# Patient Record
Sex: Female | Born: 1995 | Race: Black or African American | Hispanic: No | Marital: Single | Smoking: Never smoker
Health system: Southern US, Community
[De-identification: ages and names within clinical notes are randomized; demographics above are authoritative.]

## PROBLEM LIST (undated history)

## (undated) ENCOUNTER — Inpatient Hospital Stay (HOSPITAL_COMMUNITY): Payer: Self-pay

## (undated) DIAGNOSIS — Z789 Other specified health status: Secondary | ICD-10-CM

## (undated) DIAGNOSIS — F419 Anxiety disorder, unspecified: Secondary | ICD-10-CM

## (undated) DIAGNOSIS — F32A Depression, unspecified: Secondary | ICD-10-CM

## (undated) DIAGNOSIS — R519 Headache, unspecified: Secondary | ICD-10-CM

## (undated) DIAGNOSIS — N83209 Unspecified ovarian cyst, unspecified side: Secondary | ICD-10-CM

## (undated) DIAGNOSIS — R51 Headache: Secondary | ICD-10-CM

## (undated) DIAGNOSIS — N390 Urinary tract infection, site not specified: Secondary | ICD-10-CM

## (undated) HISTORY — PX: NO PAST SURGERIES: SHX2092

## (undated) HISTORY — PX: DILATION AND CURETTAGE OF UTERUS: SHX78

---
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
## 2017-08-03 ENCOUNTER — Emergency Department (HOSPITAL_BASED_OUTPATIENT_CLINIC_OR_DEPARTMENT_OTHER)
Admission: EM | Admit: 2017-08-03 | Discharge: 2017-08-03 | Disposition: A | Discharge status: Home / Self Care | Payer: Self-pay | Attending: Emergency Medicine | Admitting: Emergency Medicine

## 2017-08-03 ENCOUNTER — Other Ambulatory Visit: Payer: Self-pay

## 2017-08-03 ENCOUNTER — Encounter (HOSPITAL_BASED_OUTPATIENT_CLINIC_OR_DEPARTMENT_OTHER): Payer: Self-pay | Admitting: *Deleted

## 2017-08-03 DIAGNOSIS — Z202 Contact with and (suspected) exposure to infections with a predominantly sexual mode of transmission: Secondary | ICD-10-CM | POA: Insufficient documentation

## 2017-08-03 DIAGNOSIS — N898 Other specified noninflammatory disorders of vagina: Secondary | ICD-10-CM

## 2017-08-03 DIAGNOSIS — N760 Acute vaginitis: Secondary | ICD-10-CM | POA: Insufficient documentation

## 2017-08-03 DIAGNOSIS — B9689 Other specified bacterial agents as the cause of diseases classified elsewhere: Secondary | ICD-10-CM

## 2017-08-03 DIAGNOSIS — N3000 Acute cystitis without hematuria: Secondary | ICD-10-CM | POA: Insufficient documentation

## 2017-08-03 LAB — URINALYSIS, MICROSCOPIC (REFLEX)

## 2017-08-03 LAB — URINALYSIS, ROUTINE W REFLEX MICROSCOPIC
Bilirubin Urine: NEGATIVE
GLUCOSE, UA: NEGATIVE mg/dL
Hgb urine dipstick: NEGATIVE
KETONES UR: NEGATIVE mg/dL
Nitrite: POSITIVE — AB
PH: 6 (ref 5.0–8.0)
Protein, ur: NEGATIVE mg/dL
Specific Gravity, Urine: 1.015 (ref 1.005–1.030)

## 2017-08-03 LAB — WET PREP, GENITAL
Sperm: NONE SEEN
TRICH WET PREP: NONE SEEN
YEAST WET PREP: NONE SEEN

## 2017-08-03 LAB — PREGNANCY, URINE: Preg Test, Ur: NEGATIVE

## 2017-08-03 MED ORDER — METRONIDAZOLE 500 MG PO TABS
500.0000 mg | ORAL_TABLET | Freq: Once | ORAL | Status: AC
  Administered 2017-08-03: 500 mg via ORAL
  Filled 2017-08-03: qty 1

## 2017-08-03 MED ORDER — ACYCLOVIR 400 MG PO TABS: 400.0000 mg | ORAL_TABLET | Freq: Three times a day (TID) | ORAL | 0 refills | Status: AC

## 2017-08-03 MED ORDER — CEPHALEXIN 250 MG PO CAPS
500.0000 mg | ORAL_CAPSULE | Freq: Once | ORAL | Status: AC
  Administered 2017-08-03: 500 mg via ORAL
  Filled 2017-08-03: qty 2

## 2017-08-03 MED ORDER — CEPHALEXIN 500 MG PO CAPS: 500.0000 mg | ORAL_CAPSULE | Freq: Two times a day (BID) | ORAL | 0 refills | Status: AC

## 2017-08-03 MED ORDER — METRONIDAZOLE 500 MG PO TABS: 500.0000 mg | ORAL_TABLET | Freq: Two times a day (BID) | ORAL | 0 refills | Status: DC

## 2017-08-03 MED ORDER — ACYCLOVIR 200 MG PO CAPS
400.0000 mg | ORAL_CAPSULE | Freq: Once | ORAL | Status: AC
  Administered 2017-08-03: 400 mg via ORAL
  Filled 2017-08-03: qty 2

## 2017-08-03 NOTE — Discharge Instructions (Signed)
Your workup and exam today revealed concern for bacterial vaginosis and urinary tract infection.  Your pregnancy test was negative.  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  Please follow-up with an OB/GYN for further management as well as a primary care physician.  If any symptoms change or worsen, please return to the nearest emergency department.

## 2017-08-03 NOTE — ED Triage Notes (Signed)
Pt states that she thinks that she is pregnant.  States that she missed a period (LMP: Sept 27). States nausea x 2 weeks. Negative pregnancy tests at home. Also reports that she was exposed to STDs. denies vaginal discharge-reports vaginal spotting Oct 20-denies vaginal irritation.

## 2017-08-03 NOTE — ED Provider Notes (Signed)
[facility] EMERGENCY DEPARTMENT Provider Note   CSN: [id number] Arrival date & time: 08/03/17  [id number]     History   Chief Complaint Chief Complaint  Patient presents with  . Exposure to STD    HPI Jamie Moore is a 21 y.o. female.  The history is provided by the patient and medical records. No language interpreter was used.  Vaginal Discharge   This is a recurrent problem. The current episode started more than 2 days ago. The problem occurs constantly. The problem has not changed since onset.The discharge was white. She is not pregnant. She has missed her period. Associated symptoms include abdominal pain (mild cramping) and genital lesions. Pertinent negatives include no diaphoresis, no fever, no abdominal swelling, no constipation, no diarrhea, no nausea, no vomiting, no dysuria, no frequency and no genital burning. She has tried nothing for the symptoms. The treatment provided no relief. Her past medical history is significant for irregular periods. Her past medical history does not include PID or ectopic pregnancy.    History reviewed. No pertinent past medical history.  There are no active problems to display for this patient.   History reviewed. No pertinent surgical history.  OB History    No data available       Home Medications    Prior to Admission medications   Not on File    Family History History reviewed. No pertinent family history.  Social History Social History   Tobacco Use  . Smoking status: Never Smoker  . Smokeless tobacco: Never Used  Substance Use Topics  . Alcohol use: No    Frequency: Never  . Drug use: No     Allergies   Patient has no known allergies.   Review of Systems Review of Systems  Constitutional: Negative for chills, diaphoresis, fatigue and fever.  HENT: Negative for congestion.   Eyes: Negative for photophobia.  Respiratory: Negative for cough, chest tightness, shortness of breath, wheezing and  stridor.   Gastrointestinal: Positive for abdominal pain (mild cramping). Negative for constipation, diarrhea, nausea and vomiting.  Genitourinary: Positive for vaginal bleeding and vaginal discharge. Negative for decreased urine volume, dysuria, frequency, genital sores, pelvic pain, urgency and vaginal pain.  Musculoskeletal: Negative for back pain, neck pain and neck stiffness.  Skin: Positive for rash (groin). Negative for wound.  Neurological: Negative for light-headedness and numbness.  Psychiatric/Behavioral: Negative for agitation.  All other systems reviewed and are negative.    Physical Exam Updated Vital Signs BP 123/86 (BP Location: Right Arm)   Pulse 100   Temp 98.3 F (36.8 C) (Oral)   Resp 18   LMP 06/18/2017   SpO2 100%   Physical Exam  Constitutional: She appears well-developed and well-nourished. No distress.  HENT:  Head: Normocephalic and atraumatic.  Mouth/Throat: Oropharynx is clear and moist. No oropharyngeal exudate.  Eyes: Conjunctivae and EOM are normal. Pupils are equal, round, and reactive to light.  Neck: Normal range of motion. Neck supple. No JVD present.  Cardiovascular: Normal rate and regular rhythm.  No murmur heard. Pulmonary/Chest: Effort normal and breath sounds normal. No stridor. No respiratory distress. She has no wheezes. She exhibits no tenderness.  Abdominal: Soft. There is no tenderness.  Genitourinary:    Pelvic exam was performed with patient supine. There is lesion on the right labia. There is no tenderness on the right labia. There is lesion on the left labia. There is no tenderness on the left labia. Cervix exhibits discharge. Cervix exhibits no motion  tenderness and no friability. Right adnexum displays no tenderness. Left adnexum displays no tenderness and no fullness. No erythema or bleeding in the vagina. No foreign body in the vagina. Vaginal discharge found.  Musculoskeletal: She exhibits no edema.  Neurological: She is  alert. No sensory deficit. She exhibits normal muscle tone.  Skin: Skin is warm and dry. Capillary refill takes less than 2 seconds. No erythema.  Psychiatric: She has a normal mood and affect.  Nursing note and vitals reviewed.    ED Treatments / Results  Labs (all labs ordered are listed, but only abnormal results are displayed) Labs Reviewed  WET PREP, GENITAL - Abnormal; Notable for the following components:      Result Value   Clue Cells Wet Prep HPF POC PRESENT (*)    WBC, Wet Prep HPF POC MANY (*)    All other components within normal limits  URINALYSIS, ROUTINE W REFLEX MICROSCOPIC - Abnormal; Notable for the following components:   APPearance CLOUDY (*)    Nitrite POSITIVE (*)    Leukocytes, UA MODERATE (*)    All other components within normal limits  URINALYSIS, MICROSCOPIC (REFLEX) - Abnormal; Notable for the following components:   Bacteria, UA MANY (*)    Squamous Epithelial / LPF 6-30 (*)    All other components within normal limits  HSV CULTURE AND TYPING  PREGNANCY, URINE  GC/CHLAMYDIA PROBE AMP ([place]) NOT AT [facility]    EKG  EKG Interpretation None       Radiology No results found.  Procedures Procedures (including critical care time)  Medications Ordered in ED Medications  acyclovir (ZOVIRAX) 200 MG capsule 400 mg (400 mg Oral Given 08/03/17 1143)  metroNIDAZOLE (FLAGYL) tablet 500 mg (500 mg Oral Given 08/03/17 1143)  cephALEXin (KEFLEX) capsule 500 mg (500 mg Oral Given 08/03/17 1143)     Initial Impression / Assessment and Plan / ED Course  I have reviewed the triage vital signs and the nursing notes.  Pertinent labs & imaging results that were available during my care of the patient were reviewed by me and considered in my medical decision making (see chart for details).      Jamie Moore is a 21 y.o. female with a past medical history significant for prior bacterial vaginosis who presents with mild abdominal cramping, nausea  with vomiting, vaginal discharge, and some vaginal spotting.  Patient says that her last menstrual cycle was in September.  She is concerned she missed her last period.  She is concerned she may be pregnant but has had negative pregnancy tests at home.  She is also concerned about vaginal discharge.  She reports that her significant other was told he had herpes and she would like to be checked for sexually transmitted infection.  She reports a white discharge that has been ongoing.  She says that she has missed menstrual cycles in the past with irregular periods but wants to be checked for pregnancy.  She denies any recent injuries or new medications.  She denies constipation or diarrhea.  On exam, abdomen is nontender.  Lungs are clear.  Chest is nontender.  Due to the report of discharge and vaginal spotting, pelvic exam will be performed.  Wet prep will be obtained as well as STI testing.  Urinalysis will also be performed due to the abdominal discomfort.  Urinary tract infection was discovered on urinalysis.  Pregnancy test negative.  Anticipate reassessment after pelvic exam.  Pelvic exam revealed discharge but no evidence of  cervical motion tenderness or adnexal tenderness.  Swabs were collected.  External exam revealed blisters surrounding the groin.  Wet prep revealed bacterial vaginosis.  External exam revealed the blisters given the known herpes exposure, patient will be treated with acyclovir.  Herpes swab was sent.  Patient will be treated with acyclovir for likely herpes, Keflex for UTI, and Flagyl for BV.  Pregnancy test was negative.  Patient instructed to follow-up with her PCP and OB/GYN for further management.  Patient given prescriptions for the antibiotics and antiviral.  Patient understood return precautions for any new or worsening symptoms.  Patient had no other questions or concerns and was discharged in good condition.  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 STD exposure   2. Bacterial vaginosis   3. Vaginal discharge   4. 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 Acute cystitis without hematuria     Disposition: Discharge  Condition: Good  I have discussed the results, Dx and Tx plan with the pt(& family if present). He/she/they expressed understanding and agree(s) with the plan. Discharge instructions discussed at great length. Strict return precautions discussed and pt &/or family have verbalized understanding of the instructions. No further questions at time of discharge.    This SmartLink is deprecated. Use AVSMEDLIST instead to display the medication list for a patient.  Follow Up: [facility] [address] [place] [address] [phone] Schedule an appointment as soon as possible for a visit    [facility] [address] [place] [address] [phone] Schedule an appointment as soon as possible for a visit    [facility] [place] [facility] [address] [phone] mc [address] [place] [address] [phone]  If symptoms worsen     Ryver Zadrozny, Canary Brim, MD 08/03/17 2019

## 2017-08-04 LAB — GC/CHLAMYDIA PROBE AMP (~~LOC~~) NOT AT ARMC
CHLAMYDIA, DNA PROBE: POSITIVE — AB
Neisseria Gonorrhea: NEGATIVE

## 2017-08-05 LAB — HSV CULTURE AND TYPING

## 2017-08-06 MED ORDER — CEFIXIME 100 MG/5ML PO SUSR: 400.0000 mg | Freq: Every day | ORAL | 0 refills | Status: DC

## 2017-08-06 MED ORDER — AZITHROMYCIN 500 MG PO TABS: 1000.0000 mg | ORAL_TABLET | Freq: Once | ORAL | 0 refills | Status: DC

## 2017-08-07 MED ORDER — AZITHROMYCIN 500 MG PO TABS: 1000.0000 mg | ORAL_TABLET | Freq: Once | ORAL | 0 refills | Status: AC

## 2017-08-07 NOTE — ED Notes (Signed)
Pt saw positive results in my chart  Call to ed to find out what to do,  md wrote prescriptions for suprax  And azithromycin   [facility]   0010  08/07/17

## 2017-09-22 NOTE — L&D Delivery Note (Signed)
OB/GYN Faculty Practice Delivery Note  Jamie Moore is a 22 y.o. G2P0010 s/p SVD at 6862w5d. She was admitted for IOL due to IUGR <10 percentile.   ROM: 0h 3740m with moderate meconium fluid GBS Status: Positive  Maximum Maternal Temperature: 37.0 C   Labor Progress:  Patient entered active labor 05/21/18. Dilation completes at 0959.   Delivery Date/Time: 05/21/2018 1036  Delivery: Called to room and patient was complete and pushing. Head delivered ROA. No nuchal cord present. 1 natural knot in cord. Shoulder and body delivered in usual fashion. Infant with spontaneous cry, placed on mother's abdomen, dried and stimulated. Cord clamped x 2 after 1-minute delay, and cut by FOB. Cord blood drawn. Placenta delivered spontaneously with no traction. Fundus firm with massage and Pitocin. Labia, perineum, vagina, and cervix inspected inspected.   Placenta: intact  Complications: post partum hemorrhage (600 mL EBL_  Lacerations:  Two lacerations, first degree sulcal and right periurethral. Periurethral repaired with 4.0 rapide and sulcal repaired with 4.0 vicryl. EBL: 600 ml   Postpartum Planning [ ]  message to sent to schedule follow-up  [ ]  vaccines UTD  Infant: Female   APGARs 9 at 1 and 5 minutes  John Derrel NipVictor Cresenzo, MS4  [employer]  Attestation: I have seen this patient and agree with the medical student's documentation. I was present for the entire delivery and performed the repair.   Cristal DeerLaurel S. Earlene PlaterWallace, DO OB/GYN Fellow

## 2017-10-19 ENCOUNTER — Other Ambulatory Visit (HOSPITAL_BASED_OUTPATIENT_CLINIC_OR_DEPARTMENT_OTHER): Payer: Self-pay

## 2017-10-19 ENCOUNTER — Emergency Department (HOSPITAL_BASED_OUTPATIENT_CLINIC_OR_DEPARTMENT_OTHER)
Admission: EM | Admit: 2017-10-19 | Discharge: 2017-10-19 | Disposition: A | Discharge status: Home / Self Care | Payer: Managed Care, Other (non HMO) | Attending: Emergency Medicine | Admitting: Emergency Medicine

## 2017-10-19 ENCOUNTER — Other Ambulatory Visit: Payer: Self-pay

## 2017-10-19 ENCOUNTER — Encounter (HOSPITAL_BASED_OUTPATIENT_CLINIC_OR_DEPARTMENT_OTHER): Payer: Self-pay | Admitting: *Deleted

## 2017-10-19 DIAGNOSIS — Z711 Person with feared health complaint in whom no diagnosis is made: Secondary | ICD-10-CM | POA: Insufficient documentation

## 2017-10-19 DIAGNOSIS — Z3A09 9 weeks gestation of pregnancy: Secondary | ICD-10-CM | POA: Diagnosis not present

## 2017-10-19 DIAGNOSIS — O26891 Other specified pregnancy related conditions, first trimester: Secondary | ICD-10-CM | POA: Insufficient documentation

## 2017-10-19 DIAGNOSIS — R109 Unspecified abdominal pain: Secondary | ICD-10-CM | POA: Insufficient documentation

## 2017-10-19 DIAGNOSIS — N898 Other specified noninflammatory disorders of vagina: Secondary | ICD-10-CM | POA: Insufficient documentation

## 2017-10-19 DIAGNOSIS — O26899 Other specified pregnancy related conditions, unspecified trimester: Secondary | ICD-10-CM

## 2017-10-19 LAB — WET PREP, GENITAL
SPERM: NONE SEEN
Trich, Wet Prep: NONE SEEN
Yeast Wet Prep HPF POC: NONE SEEN

## 2017-10-19 LAB — URINALYSIS, MICROSCOPIC (REFLEX)

## 2017-10-19 LAB — URINALYSIS, ROUTINE W REFLEX MICROSCOPIC
Bilirubin Urine: NEGATIVE
GLUCOSE, UA: NEGATIVE mg/dL
Hgb urine dipstick: NEGATIVE
KETONES UR: NEGATIVE mg/dL
NITRITE: NEGATIVE
PH: 6.5 (ref 5.0–8.0)
PROTEIN: NEGATIVE mg/dL
Specific Gravity, Urine: 1.02 (ref 1.005–1.030)

## 2017-10-19 LAB — GC/CHLAMYDIA PROBE AMP (~~LOC~~) NOT AT ARMC
Chlamydia: POSITIVE — AB
NEISSERIA GONORRHEA: NEGATIVE

## 2017-10-19 LAB — HCG, QUANTITATIVE, PREGNANCY: hCG, Beta Chain, Quant, S: 127902 m[IU]/mL — ABNORMAL HIGH (ref <5)

## 2017-10-19 MED ORDER — AZITHROMYCIN 250 MG PO TABS
1000.0000 mg | ORAL_TABLET | Freq: Once | ORAL | Status: AC
  Administered 2017-10-19: 1000 mg via ORAL
  Filled 2017-10-19: qty 4

## 2017-10-19 MED ORDER — LIDOCAINE HCL (PF) 1 % IJ SOLN
INTRAMUSCULAR | Status: AC
  Administered 2017-10-19: 0.9 mL
  Filled 2017-10-19: qty 5

## 2017-10-19 MED ORDER — CEFTRIAXONE SODIUM 250 MG IJ SOLR
250.0000 mg | Freq: Once | INTRAMUSCULAR | Status: AC
  Administered 2017-10-19: 250 mg via INTRAMUSCULAR
  Filled 2017-10-19: qty 250

## 2017-10-19 NOTE — ED Provider Notes (Signed)
[facility] EMERGENCY DEPARTMENT Provider Note   CSN: [id number] Arrival date & time: 10/19/17  0406     History   Chief Complaint Chief Complaint  Patient presents with  . [redacted] weeks pregnant and cramping.    HPI Jamie Moore is a 22 y.o. female.  HPI  This is a 22 year old G2P0 currently [redacted] weeks pregnant who presents with abdominal and concerns for STD.  Patient's last menstrual period was August 14, 2017.  She has had suprapubic cramping since finding out she was pregnant.  She reports 1 day over 2 weeks ago where she had some mild vaginal spotting.  She did not see a physician.  She did have an ultrasound when she found out she was pregnant.  At that time there was a single intrauterine gestational sac at approximately 5 weeks.  She has not had any further ultrasound testing.  She is due to see her OB on February 8.  Patient reports that she has taken Tylenol occasionally with some relief.  Currently she rates her pain at 3 out of 10.  She reports a vaginal discharge and is concerned for STDs as a partner has tested positive for chlamydia.  She denies any dysuria or back pain.  History reviewed. No pertinent past medical history.  There are no active problems to display for this patient.   History reviewed. No pertinent surgical history.  OB History    Gravida Para Term Preterm AB Living   1             SAB TAB Ectopic Multiple Live Births                   Home Medications    Prior to Admission medications   Medication Sig Start Date End Date Taking? Authorizing Provider  cefixime (SUPRAX) 100 MG/5ML suspension Take 20 mLs (400 mg total) daily by mouth. 08/06/17   Tegeler, Canary Brim, MD  metroNIDAZOLE (FLAGYL) 500 MG tablet Take 1 tablet (500 mg total) 2 (two) times daily by mouth. 08/03/17   Tegeler, Canary Brim, MD    Family History No family history on file.  Social History Social History   Tobacco Use  . Smoking status: Never Smoker    . Smokeless tobacco: Never Used  Substance Use Topics  . Alcohol use: No    Frequency: Never  . Drug use: No     Allergies   Patient has no known allergies.   Review of Systems Review of Systems  Constitutional: Negative for fever.  Gastrointestinal: Positive for abdominal pain. Negative for nausea and vomiting.  Genitourinary: Positive for vaginal discharge. Negative for dysuria and hematuria.  All other systems reviewed and are negative.    Physical Exam Updated Vital Signs BP 117/77 (BP Location: Left Arm)   Pulse 84   Temp 98.5 F (36.9 C) (Oral)   Resp 18   Ht 5\' 3"  (1.6 m)   Wt 77.1 kg (170 lb)   LMP 08/16/2017 (Exact Date)   SpO2 100%   BMI 30.11 kg/m   Physical Exam  Constitutional: She is oriented to person, place, and time. She appears well-developed and well-nourished. No distress.  HENT:  Head: Normocephalic and atraumatic.  Cardiovascular: Normal rate, regular rhythm and normal heart sounds.  No murmur heard. Pulmonary/Chest: Effort normal and breath sounds normal. No respiratory distress. She has no wheezes.  Abdominal: Soft. There is no tenderness.  Genitourinary:  Genitourinary Comments: Moderate white vaginal discharge, no cervical motion  tenderness, no adnexal tenderness or mass noted  Neurological: She is alert and oriented to person, place, and time.  Skin: Skin is warm and dry.  Psychiatric: She has a normal mood and affect.  Nursing note and vitals reviewed.    ED Treatments / Results  Labs (all labs ordered are listed, but only abnormal results are displayed) Labs Reviewed  WET PREP, GENITAL - Abnormal; Notable for the following components:      Result Value   Clue Cells Wet Prep HPF POC PRESENT (*)    WBC, Wet Prep HPF POC MANY (*)    All other components within normal limits  URINALYSIS, ROUTINE W REFLEX MICROSCOPIC - Abnormal; Notable for the following components:   APPearance CLOUDY (*)    Leukocytes, UA TRACE (*)    All  other components within normal limits  URINALYSIS, MICROSCOPIC (REFLEX) - Abnormal; Notable for the following components:   Bacteria, UA MANY (*)    Squamous Epithelial / LPF 6-30 (*)    All other components within normal limits  URINE CULTURE  HCG, QUANTITATIVE, PREGNANCY  GC/CHLAMYDIA PROBE AMP ([place]) NOT AT [facility]    EKG  EKG Interpretation None       Radiology No results found.  Procedures Procedures (including critical care time)  EMERGENCY DEPARTMENT US PREGNANCY "Study: Limited Ultrasound of the Pelvis for Pregnancy"  INDICATIONS:Pregnancy(required) Multiple views of the uterus and pelvic cavity were obtained in real-time with a multi-frequency probe.  APPROACH:Transabdominal  PERFORMED BY: Myself IMAGES ARCHIVED?: Yes LIMITATIONS: Emergent procedure PREGNANCY FREE FLUID: None ADNEXAL FINDINGS: not visualize GESTATIONAL AGE, ESTIMATE: 9 wks FETAL HEART RATE: 167 INTERPRETATION: Yolk sac noted and Fetal heart activity seen      Medications Ordered in ED Medications  cefTRIAXone (ROCEPHIN) injection 250 mg (not administered)  azithromycin (ZITHROMAX) tablet 1,000 mg (not administered)  lidocaine (PF) (XYLOCAINE) 1 % injection (not administered)     Initial Impression / Assessment and Plan / ED Course  I have reviewed the triage vital signs and the nursing notes.  Pertinent labs & imaging results that were available during my care of the patient were reviewed by me and considered in my medical decision making (see chart for details).     She presents with persistent abdominal cramping during pregnancy and concerns for STD.  She is nontoxic on exam.  No lateralizing tenderness or adnexal masses noted on exam.  No bleeding noted.  Bedside ultrasound with single live intrauterine pregnancy with fetal heart rate of 167.  This is reassuring.  Patient was tested and treated for STDs.  Instructed to abstain from sexual activity for the next 10 days and  always use condoms.  Recommend close OB follow-up and formal ultrasound later today.  At this time no indication for heterotopic pregnancy.  After history, exam, and medical workup I feel the patient has been appropriately medically screened and is safe for discharge home. Pertinent diagnoses were discussed with the patient. Patient was given return precautions.   Final Clinical Impressions(s) / ED Diagnoses   Final diagnoses:  Concern about STD in female without diagnosis  Abdominal pain affecting pregnancy    ED Discharge Orders        Ordered    US OP OB Comp Less 14 Wks     10/19/17 0522       Shon BatonHorton, Sharrell Krawiec F, MD 10/19/17 [phone]

## 2017-10-19 NOTE — Discharge Instructions (Signed)
You were seen today with concerns for an STD.  You also experienced some abdominal cramping during pregnancy.  Your bedside ultrasound is reassuring with a fetal heart rate of 167.  It is very important that you follow-up for formal ultrasound and see her OB/GYN.  If you develop bleeding or any new or worsening symptoms you need to be reevaluated.  Regarding STDs, you were treated.  Abstain from sexual activity for the next 10 days.  Always use condoms.

## 2017-10-19 NOTE — ED Triage Notes (Signed)
Pt c/o lower abdominal cramping for the past 2 weeks. On and off per pt. C/o general h/a that she attributes to her cramping. Pt states that she could have been exposed to an STD specifically  Chlamydia. C/o vaginal d/c that is white in color.

## 2017-10-19 NOTE — ED Notes (Signed)
Snack given.

## 2017-10-21 LAB — URINE CULTURE: Culture: >=100000 — AB

## 2017-10-22 ENCOUNTER — Telehealth: Payer: Self-pay | Admitting: *Deleted

## 2017-10-22 NOTE — Telephone Encounter (Signed)
Post ED Visit - Positive Culture Follow-up: Unsuccessful Patient Follow-up  Culture assessed and recommendations reviewed by:  []  Enzo BiNathan Batchelder, Pharm.D. []  Celedonio MiyamotoJeremy Frens, Pharm.D., BCPS AQ-ID [x]  Garvin FilaMike Maccia, Pharm.D., BCPS []  Georgina PillionElizabeth Martin, Pharm.D., BCPS []  BrockwayMinh Pham, [address]Pharm.D., BCPS, AAHIVP []  Estella HuskMichelle Turner, Pharm.D., BCPS, AAHIVP []  Lysle Pearlachel Rumbarger, PharmD, BCPS []  Casilda Carlsaylor Stone, PharmD, BCPS []  Pollyann SamplesAndy Johnston, PharmD, BCPS  Positive urine culture  [x]  Patient discharged without antimicrobial prescription and treatment is now indicated []  Organism is resistant to prescribed ED discharge antimicrobial []  Patient with positive blood cultures   Unable to contact patient after 3 attempts, letter will be sent to address on file  Lysle PearlRobertson, Rasheeda [place]ve[place] Talley 10/22/2017, 10:01 AM

## 2017-10-22 NOTE — Progress Notes (Signed)
ED Antimicrobial Stewardship Positive Culture Follow Up   Jamie Moore is an 22 y.o. female who [facility] with a chief complaint of  Chief Complaint  Patient presents with  . [redacted] weeks pregnant and cramping.    Recent Results (from the past 720 hour(s))  Urine culture     Status: Abnormal   Collection Time: 10/19/17  4:25 AM  Result Value Ref Range Status   Specimen Description URINE, RANDOM  Final   Special Requests NONE  Final   Culture >=100,000 COLONIES/mL ESCHERICHIA COLI (A)  Final   Report Status 10/21/2017 FINAL  Final   Organism ID, Bacteria ESCHERICHIA COLI (A)  Final      Susceptibility   Escherichia coli - MIC*    AMPICILLIN <=2 SENSITIVE Sensitive     CEFAZOLIN <=4 SENSITIVE Sensitive     CEFTRIAXONE <=1 SENSITIVE Sensitive     CIPROFLOXACIN >=4 RESISTANT Resistant     GENTAMICIN <=1 SENSITIVE Sensitive     IMIPENEM <=0.25 SENSITIVE Sensitive     NITROFURANTOIN <=16 SENSITIVE Sensitive     TRIMETH/SULFA <=20 SENSITIVE Sensitive     AMPICILLIN/SULBACTAM <=2 SENSITIVE Sensitive     PIP/TAZO <=4 SENSITIVE Sensitive     Extended ESBL NEGATIVE Sensitive     * >=100,000 COLONIES/mL ESCHERICHIA COLI  Wet prep, genital     Status: Abnormal   Collection Time: 10/19/17  5:01 AM  Result Value Ref Range Status   Yeast Wet Prep HPF POC NONE SEEN NONE SEEN Final   Trich, Wet Prep NONE SEEN NONE SEEN Final   Clue Cells Wet Prep HPF POC PRESENT (A) NONE SEEN Final   WBC, Wet Prep HPF POC MANY (A) NONE SEEN Final   Sperm NONE SEEN  Final   [x]  Patient discharged originally without antimicrobial agent and treatment is now indicated Asymptomatic bacteriuria in pregnant patient  New antibiotic prescription: amox 500 bid x 1 week  ED Provider: Terance Hart*Jamie Gekas, PA-C  Jamie Moore 10/22/2017, 8:49 AM Infectious Diseases Pharmacist Phone# [phone]

## 2018-03-04 ENCOUNTER — Other Ambulatory Visit (HOSPITAL_COMMUNITY): Payer: Self-pay | Admitting: Specialist

## 2018-03-04 DIAGNOSIS — Z3A31 31 weeks gestation of pregnancy: Secondary | ICD-10-CM

## 2018-03-10 ENCOUNTER — Encounter (HOSPITAL_COMMUNITY): Payer: Self-pay

## 2018-03-17 ENCOUNTER — Encounter (HOSPITAL_COMMUNITY): Payer: Self-pay | Admitting: *Deleted

## 2018-03-19 ENCOUNTER — Ambulatory Visit (HOSPITAL_COMMUNITY)
Admission: RE | Admit: 2018-03-19 | Discharge: 2018-03-19 | Disposition: A | Discharge status: Home / Self Care | Payer: Managed Care, Other (non HMO) | Source: Ambulatory Visit | Attending: Specialist | Admitting: Specialist

## 2018-03-19 ENCOUNTER — Encounter (HOSPITAL_COMMUNITY): Payer: Self-pay

## 2018-03-19 ENCOUNTER — Other Ambulatory Visit (HOSPITAL_COMMUNITY): Payer: Self-pay | Admitting: Specialist

## 2018-03-19 DIAGNOSIS — Z363 Encounter for antenatal screening for malformations: Secondary | ICD-10-CM

## 2018-03-19 DIAGNOSIS — Z3A30 30 weeks gestation of pregnancy: Secondary | ICD-10-CM | POA: Insufficient documentation

## 2018-03-19 DIAGNOSIS — O365930 Maternal care for other known or suspected poor fetal growth, third trimester, not applicable or unspecified: Secondary | ICD-10-CM | POA: Insufficient documentation

## 2018-03-19 DIAGNOSIS — Z3A31 31 weeks gestation of pregnancy: Secondary | ICD-10-CM

## 2018-03-19 DIAGNOSIS — Z3689 Encounter for other specified antenatal screening: Secondary | ICD-10-CM | POA: Insufficient documentation

## 2018-03-19 DIAGNOSIS — O365990 Maternal care for other known or suspected poor fetal growth, unspecified trimester, not applicable or unspecified: Secondary | ICD-10-CM | POA: Insufficient documentation

## 2018-03-19 HISTORY — DX: Other specified health status: Z78.9

## 2018-03-19 NOTE — Progress Notes (Signed)
MFM Consult, Staff Note:   Impressions: Single living intrauterine pregnancy at 30w 5d.   Placenta Anterior, above cervical os.   EFW 15th%'le with AC <3rd%'le is consistent with IUGR Normal amniotic fluid volume. The fetal anatomic survey is not complete. No gross fetal anomalies identified.  UA Doppler studies are normal BPP 8/8 The cervix measures 3.44cm transabdominally without funneling. The adnexa appear normal bilaterally without masses.    Discussion:  At bedside, I reviewed today's ultrasound findings and placed them in context of my impression and needed pregnancy surveillance.  I explained to her that according to current obstetrical conventions, her fetus meets criteria for intrauterine growth restriction.  That being said, her fetus demonstrates normal UA Doppler waveforms, normal AFI, and a reassuring biophysical profile.  I advised her that in setting of uncomplicated IUGR, she would need to monitor fetal kick counts at home and have weekly fluid volume assessments, biophysical profiles, and umbilical artery Doppler studies with continued surveillance of interval fetal growth as plotted by composite biometric assessments every 3-4 weeks of this pregnancy until delivery.  Provided the IUGR diagnosis remains uncomplicated and antenatal testing remains reassuring, delivery at 38-39 weeks is recommended in accordance with ACOG and SMFM guidelines.  Summary of Recommendations: 1. continued interval growth monthly 2. weekly UA Doppler/BPP/AFI 3. fetal kick counts 4. provided IUGR remains uncomplicated, recommend delivery 38-39 weeks. 5. If delivery is required for worsening IUGR/preeclampsia   Time Spent: I spent in excess of 20 minutes in consultation with this patient to review records, evaluate her case, and provide her with an adequate discussion and education.  More than 50% of this time was spent in direct face-to-face counseling.  It was a pleasure seeing your patient in  the office today.  Thank you for consultation. Please do not hesitate to contact our service for any further questions.  Thank you,  Louann SjogrenJeffrey Morgan Gaynelle Arabianenney   Romain Erion, Louann SjogrenJeffrey Morgan, MD, MS, FACOG Assistant Professor Section of Maternal-Fetal Medicine [facility]

## 2018-03-19 NOTE — Consult Note (Signed)
MFM Consult, Staff Note:   Impressions: Single living intrauterine pregnancy at 30w 5d.   Placenta Anterior, above cervical os.   EFW 15th%'le with AC <3rd%'le is consistent with IUGR Normal amniotic fluid volume. The fetal anatomic survey is not complete. No gross fetal anomalies identified.  UA Doppler studies are normal BPP 8/8 The cervix measures 3.44cm transabdominally without funneling. The adnexa appear normal bilaterally without masses.    Discussion:  At bedside, I reviewed today's ultrasound findings and placed them in context of my impression and needed pregnancy surveillance.  I explained to her that according to current obstetrical conventions, her fetus meets criteria for intrauterine growth restriction.  That being said, her fetus demonstrates normal UA Doppler waveforms, normal AFI, and a reassuring biophysical profile.  I advised her that in setting of uncomplicated IUGR, she would need to monitor fetal kick counts at home and have weekly fluid volume assessments, biophysical profiles, and umbilical artery Doppler studies with continued surveillance of interval fetal growth as plotted by composite biometric assessments every 3-4 weeks of this pregnancy until delivery.  Provided the IUGR diagnosis remains uncomplicated and antenatal testing remains reassuring, delivery at 38-39 weeks is recommended in accordance with ACOG and SMFM guidelines.  Summary of Recommendations: 1. continued interval growth monthly 2. weekly UA Doppler/BPP/AFI 3. fetal kick counts 4. provided IUGR remains uncomplicated, recommend delivery 38-39 weeks. 5. If delivery is required for worsening IUGR/preeclampsia   Time Spent: I spent in excess of 20 minutes in consultation with this patient to review records, evaluate her case, and provide her with an adequate discussion and education.  More than 50% of this time was spent in direct face-to-face counseling.  It was a pleasure seeing your patient in  the office today.  Thank you for consultation. Please do not hesitate to contact our service for any further questions.  Thank you,  Tymothy Cass Morgan Demarquez Ciolek   Rola Lennon Morgan, MD, MS, FACOG Assistant Professor Section of Maternal-Fetal Medicine [employer]   

## 2018-03-22 ENCOUNTER — Other Ambulatory Visit (HOSPITAL_COMMUNITY): Payer: Self-pay | Admitting: *Deleted

## 2018-03-22 DIAGNOSIS — O365930 Maternal care for other known or suspected poor fetal growth, third trimester, not applicable or unspecified: Secondary | ICD-10-CM

## 2018-03-26 ENCOUNTER — Other Ambulatory Visit (HOSPITAL_COMMUNITY): Payer: Self-pay | Admitting: Obstetrics and Gynecology

## 2018-03-26 ENCOUNTER — Ambulatory Visit (HOSPITAL_COMMUNITY)
Admission: RE | Admit: 2018-03-26 | Discharge: 2018-03-26 | Disposition: A | Discharge status: Home / Self Care | Payer: Managed Care, Other (non HMO) | Source: Ambulatory Visit | Attending: Specialist | Admitting: Specialist

## 2018-03-26 ENCOUNTER — Encounter (HOSPITAL_COMMUNITY): Payer: Self-pay

## 2018-03-26 DIAGNOSIS — O365930 Maternal care for other known or suspected poor fetal growth, third trimester, not applicable or unspecified: Secondary | ICD-10-CM

## 2018-03-26 DIAGNOSIS — Z3A31 31 weeks gestation of pregnancy: Secondary | ICD-10-CM | POA: Diagnosis not present

## 2018-04-01 ENCOUNTER — Ambulatory Visit (HOSPITAL_COMMUNITY)
Admission: RE | Admit: 2018-04-01 | Discharge: 2018-04-01 | Disposition: A | Discharge status: Home / Self Care | Payer: Managed Care, Other (non HMO) | Source: Ambulatory Visit | Attending: Specialist | Admitting: Specialist

## 2018-04-08 ENCOUNTER — Other Ambulatory Visit (HOSPITAL_COMMUNITY): Payer: Self-pay | Admitting: Obstetrics and Gynecology

## 2018-04-08 ENCOUNTER — Encounter (HOSPITAL_COMMUNITY): Payer: Self-pay

## 2018-04-08 ENCOUNTER — Ambulatory Visit (HOSPITAL_COMMUNITY)
Admission: RE | Admit: 2018-04-08 | Discharge: 2018-04-08 | Disposition: A | Discharge status: Home / Self Care | Payer: Managed Care, Other (non HMO) | Source: Ambulatory Visit | Attending: Specialist | Admitting: Specialist

## 2018-04-08 DIAGNOSIS — Z362 Encounter for other antenatal screening follow-up: Secondary | ICD-10-CM

## 2018-04-08 DIAGNOSIS — Z3A33 33 weeks gestation of pregnancy: Secondary | ICD-10-CM | POA: Diagnosis not present

## 2018-04-08 DIAGNOSIS — O365930 Maternal care for other known or suspected poor fetal growth, third trimester, not applicable or unspecified: Secondary | ICD-10-CM

## 2018-04-15 ENCOUNTER — Encounter (HOSPITAL_COMMUNITY): Payer: Self-pay

## 2018-04-15 ENCOUNTER — Other Ambulatory Visit (HOSPITAL_COMMUNITY): Payer: Self-pay | Admitting: Obstetrics and Gynecology

## 2018-04-15 ENCOUNTER — Ambulatory Visit (HOSPITAL_COMMUNITY)
Admission: RE | Admit: 2018-04-15 | Discharge: 2018-04-15 | Disposition: A | Discharge status: Home / Self Care | Payer: Managed Care, Other (non HMO) | Source: Ambulatory Visit | Attending: Specialist | Admitting: Specialist

## 2018-04-15 DIAGNOSIS — Z3A34 34 weeks gestation of pregnancy: Secondary | ICD-10-CM | POA: Diagnosis not present

## 2018-04-15 DIAGNOSIS — O365930 Maternal care for other known or suspected poor fetal growth, third trimester, not applicable or unspecified: Secondary | ICD-10-CM | POA: Diagnosis present

## 2018-04-16 ENCOUNTER — Other Ambulatory Visit (HOSPITAL_COMMUNITY): Payer: Self-pay | Admitting: *Deleted

## 2018-04-16 DIAGNOSIS — O365930 Maternal care for other known or suspected poor fetal growth, third trimester, not applicable or unspecified: Secondary | ICD-10-CM

## 2018-04-22 ENCOUNTER — Encounter (HOSPITAL_COMMUNITY): Payer: Self-pay

## 2018-04-22 ENCOUNTER — Other Ambulatory Visit (HOSPITAL_COMMUNITY): Payer: Self-pay | Admitting: Obstetrics and Gynecology

## 2018-04-22 ENCOUNTER — Ambulatory Visit (HOSPITAL_COMMUNITY)
Admission: RE | Admit: 2018-04-22 | Discharge: 2018-04-22 | Disposition: A | Discharge status: Home / Self Care | Payer: Managed Care, Other (non HMO) | Source: Ambulatory Visit | Attending: Specialist | Admitting: Specialist

## 2018-04-22 DIAGNOSIS — O365930 Maternal care for other known or suspected poor fetal growth, third trimester, not applicable or unspecified: Secondary | ICD-10-CM

## 2018-04-22 DIAGNOSIS — Z3A35 35 weeks gestation of pregnancy: Secondary | ICD-10-CM

## 2018-04-29 ENCOUNTER — Other Ambulatory Visit (HOSPITAL_COMMUNITY): Payer: Self-pay | Admitting: Obstetrics and Gynecology

## 2018-04-29 ENCOUNTER — Ambulatory Visit (HOSPITAL_COMMUNITY)
Admission: RE | Admit: 2018-04-29 | Discharge: 2018-04-29 | Disposition: A | Discharge status: Home / Self Care | Payer: Managed Care, Other (non HMO) | Source: Ambulatory Visit | Attending: Specialist | Admitting: Specialist

## 2018-04-29 ENCOUNTER — Encounter (HOSPITAL_COMMUNITY): Payer: Self-pay

## 2018-04-29 DIAGNOSIS — O365930 Maternal care for other known or suspected poor fetal growth, third trimester, not applicable or unspecified: Secondary | ICD-10-CM

## 2018-04-29 DIAGNOSIS — Z3A36 36 weeks gestation of pregnancy: Secondary | ICD-10-CM | POA: Diagnosis not present

## 2018-05-03 ENCOUNTER — Inpatient Hospital Stay (HOSPITAL_COMMUNITY)
Admission: AD | Admit: 2018-05-03 | Discharge: 2018-05-03 | Disposition: A | Discharge status: Home / Self Care | Payer: Managed Care, Other (non HMO) | Source: Ambulatory Visit | Attending: Obstetrics and Gynecology | Admitting: Obstetrics and Gynecology

## 2018-05-03 ENCOUNTER — Encounter (HOSPITAL_COMMUNITY): Payer: Self-pay | Admitting: *Deleted

## 2018-05-03 DIAGNOSIS — B9689 Other specified bacterial agents as the cause of diseases classified elsewhere: Secondary | ICD-10-CM

## 2018-05-03 DIAGNOSIS — N898 Other specified noninflammatory disorders of vagina: Secondary | ICD-10-CM | POA: Insufficient documentation

## 2018-05-03 DIAGNOSIS — Z0371 Encounter for suspected problem with amniotic cavity and membrane ruled out: Secondary | ICD-10-CM | POA: Diagnosis not present

## 2018-05-03 DIAGNOSIS — O26893 Other specified pregnancy related conditions, third trimester: Secondary | ICD-10-CM | POA: Insufficient documentation

## 2018-05-03 DIAGNOSIS — Z3A37 37 weeks gestation of pregnancy: Secondary | ICD-10-CM

## 2018-05-03 DIAGNOSIS — N760 Acute vaginitis: Secondary | ICD-10-CM

## 2018-05-03 DIAGNOSIS — B3731 Acute candidiasis of vulva and vagina: Secondary | ICD-10-CM

## 2018-05-03 DIAGNOSIS — B373 Candidiasis of vulva and vagina: Secondary | ICD-10-CM

## 2018-05-03 LAB — POCT FERN TEST: POCT Fern Test: NEGATIVE

## 2018-05-03 LAB — WET PREP, GENITAL
SPERM: NONE SEEN
Trich, Wet Prep: NONE SEEN

## 2018-05-03 MED ORDER — TERCONAZOLE 0.4 % VA CREA: 1.0000 | TOPICAL_CREAM | Freq: Every day | VAGINAL | 0 refills | Status: DC

## 2018-05-03 MED ORDER — METRONIDAZOLE 500 MG PO TABS: 500.0000 mg | ORAL_TABLET | Freq: Two times a day (BID) | ORAL | 0 refills | Status: DC

## 2018-05-03 NOTE — Discharge Instructions (Signed)
Braxton Hicks Contractions °Contractions of the uterus can occur throughout pregnancy, but they are not always a sign that you are in labor. You may have practice contractions called Braxton Hicks contractions. These false labor contractions are sometimes confused with true labor. °What are Braxton Hicks contractions? °Braxton Hicks contractions are tightening movements that occur in the muscles of the uterus before labor. Unlike true labor contractions, these contractions do not result in opening (dilation) and thinning of the cervix. Toward the end of pregnancy (32-34 weeks), Braxton Hicks contractions can happen more often and may become stronger. These contractions are sometimes difficult to tell apart from true labor because they can be very uncomfortable. You should not feel embarrassed if you go to the hospital with false labor. °Sometimes, the only way to tell if you are in true labor is for your health care provider to look for changes in the cervix. The health care provider will do a physical exam and may monitor your contractions. If you are not in true labor, the exam should show that your cervix is not dilating and your water has not broken. °If there are other health problems associated with your pregnancy, it is completely safe for you to be sent home with false labor. You may continue to have Braxton Hicks contractions until you go into true labor. °How to tell the difference between true labor and false labor °True labor °· Contractions last 30-70 seconds. °· Contractions become very regular. °· Discomfort is usually felt in the top of the uterus, and it spreads to the lower abdomen and low back. °· Contractions do not go away with walking. °· Contractions usually become more intense and increase in frequency. °· The cervix dilates and gets thinner. °False labor °· Contractions are usually shorter and not as strong as true labor contractions. °· Contractions are usually irregular. °· Contractions  are often felt in the front of the lower abdomen and in the groin. °· Contractions may go away when you walk around or change positions while lying down. °· Contractions get weaker and are shorter-lasting as time goes on. °· The cervix usually does not dilate or become thin. °Follow these instructions at home: °· Take over-the-counter and prescription medicines only as told by your health care provider. °· Keep up with your usual exercises and follow other instructions from your health care provider. °· Eat and drink lightly if you think you are going into labor. °· If Braxton Hicks contractions are making you uncomfortable: °? Change your position from lying down or resting to walking, or change from walking to resting. °? Sit and rest in a tub of warm water. °? Drink enough fluid to keep your urine pale yellow. Dehydration may cause these contractions. °? Do slow and deep breathing several times an hour. °· Keep all follow-up prenatal visits as told by your health care provider. This is important. °Contact a health care provider if: °· You have a fever. °· You have continuous pain in your abdomen. °Get help right away if: °· Your contractions become stronger, more regular, and closer together. °· You have fluid leaking or gushing from your vagina. °· You pass blood-tinged mucus (bloody show). °· You have bleeding from your vagina. °· You have low back pain that you never had before. °· You feel your baby’s head pushing down and causing pelvic pressure. °· Your baby is not moving inside you as much as it used to. °Summary °· Contractions that occur before labor are called Braxton   Hicks contractions, false labor, or practice contractions. °· Braxton Hicks contractions are usually shorter, weaker, farther apart, and less regular than true labor contractions. True labor contractions usually become progressively stronger and regular and they become more frequent. °· Manage discomfort from Braxton Hicks contractions by  changing position, resting in a warm bath, drinking plenty of water, or practicing deep breathing. °This information is not intended to replace advice given to you by your health care provider. Make sure you discuss any questions you have with your health care provider. °Document Released: 01/22/2017 Document Revised: 01/22/2017 Document Reviewed: 01/22/2017 °Elsevier Interactive Patient Education © 2018 [employer] ° °

## 2018-05-03 NOTE — MAU Note (Signed)
Pt states she started clear, watery leaking fluid 2 days ago. States her shorts were soaked. Pt lower abdominal cramping that started yesterday. Pt denies vaginal bleeding. Reports good fetal movement. Pt states she gets her care with Dr. Shawnie Ponsorn but plans to deliver here.

## 2018-05-03 NOTE — MAU Provider Note (Signed)
First Provider Initiated Contact with Patient 05/03/18 2154     S: Ms. Jamie Moore is a 22 y.o. G2P0010 at 4145w1d  who presents to MAU today complaining of leaking of fluid since 2 days ago. She denies vaginal bleeding. She endorses contractions. She reports normal fetal movement.    O: BP 120/75 (BP Location: Right Arm)   Pulse (!) 108   Temp 99.6 F (37.6 C) (Oral)   Resp 18   Ht 5\' 4"  (1.626 m)   Wt 85.7 kg   LMP 08/16/2017 (Exact Date)   SpO2 100%   BMI 32.44 kg/m  GENERAL: Well-developed, well-nourished female in no acute distress.  HEAD: Normocephalic, atraumatic.  CHEST: Normal effort of breathing, regular heart rate ABDOMEN: Soft, nontender, gravid PELVIC: Normal external female genitalia. Vagina is pink and rugated. Cervix with normal contour, no lesions. Normal discharge.  No pooling.   Cervical exam:  Dilation: Fingertip Effacement (%): 50 Cervical Position: Middle Exam by:: Ma Hillock. Chantrell Apsey CNM   Fetal Monitoring: Baseline: 130 Variability: moderate Accelerations: 15x15 Decelerations: none Contractions: UI  A: SIUP at 4745w1d  Membranes intact  P: Discharge home  Labor precautions reviewed Patient can return to MAU if condition worsens or changes  Rolm Bookbindereill, Esias Mory M, [place] 05/03/2018 9:55 PM

## 2018-05-05 LAB — GC/CHLAMYDIA PROBE AMP (~~LOC~~) NOT AT ARMC
Chlamydia: POSITIVE — AB
NEISSERIA GONORRHEA: NEGATIVE

## 2018-05-06 ENCOUNTER — Encounter (HOSPITAL_COMMUNITY): Payer: Self-pay

## 2018-05-06 ENCOUNTER — Inpatient Hospital Stay (HOSPITAL_COMMUNITY)
Admission: AD | Admit: 2018-05-06 | Discharge: 2018-05-06 | Disposition: A | Discharge status: Home / Self Care | Payer: Managed Care, Other (non HMO) | Source: Ambulatory Visit | Attending: Obstetrics & Gynecology | Admitting: Obstetrics & Gynecology

## 2018-05-06 ENCOUNTER — Other Ambulatory Visit (HOSPITAL_COMMUNITY): Payer: Self-pay | Admitting: Obstetrics and Gynecology

## 2018-05-06 ENCOUNTER — Ambulatory Visit (HOSPITAL_BASED_OUTPATIENT_CLINIC_OR_DEPARTMENT_OTHER)
Admission: RE | Admit: 2018-05-06 | Discharge: 2018-05-06 | Disposition: A | Discharge status: Home / Self Care | Payer: Managed Care, Other (non HMO) | Source: Ambulatory Visit | Attending: Specialist | Admitting: Specialist

## 2018-05-06 DIAGNOSIS — O365930 Maternal care for other known or suspected poor fetal growth, third trimester, not applicable or unspecified: Secondary | ICD-10-CM

## 2018-05-06 DIAGNOSIS — A749 Chlamydial infection, unspecified: Secondary | ICD-10-CM

## 2018-05-06 DIAGNOSIS — Z3689 Encounter for other specified antenatal screening: Secondary | ICD-10-CM

## 2018-05-06 DIAGNOSIS — O368190 Decreased fetal movements, unspecified trimester, not applicable or unspecified: Secondary | ICD-10-CM

## 2018-05-06 DIAGNOSIS — A568 Sexually transmitted chlamydial infection of other sites: Secondary | ICD-10-CM | POA: Diagnosis not present

## 2018-05-06 DIAGNOSIS — O98313 Other infections with a predominantly sexual mode of transmission complicating pregnancy, third trimester: Secondary | ICD-10-CM | POA: Diagnosis not present

## 2018-05-06 DIAGNOSIS — O368130 Decreased fetal movements, third trimester, not applicable or unspecified: Secondary | ICD-10-CM

## 2018-05-06 DIAGNOSIS — Z3A37 37 weeks gestation of pregnancy: Secondary | ICD-10-CM

## 2018-05-06 MED ORDER — AZITHROMYCIN 250 MG PO TABS
1000.0000 mg | ORAL_TABLET | Freq: Once | ORAL | Status: AC
  Administered 2018-05-06: 1000 mg via ORAL
  Filled 2018-05-06: qty 4

## 2018-05-06 NOTE — MAU Note (Signed)
Decreased movement over the past couple days- has felt some but not as much as normal  Reports IUGR  No bleeding, no LOF, some left lower abdominal pain

## 2018-05-06 NOTE — Discharge Instructions (Signed)

## 2018-05-06 NOTE — ED Notes (Signed)
[facility] per Dr. Judeth CornfieldShankar.  Report called to Isabel CapriceKathy Wilson, RN.  Pt ambulated and checked into MAU for further monitoring.

## 2018-05-06 NOTE — MAU Provider Note (Signed)
History     CSN: [id number]  Arrival date and time: 05/06/18 1708   First Provider Initiated Contact with Patient 05/06/18 1727      Chief Complaint  Patient presents with  . Decreased Fetal Movement   HPI   Ms.Jamie Moore is a 22 y.o. female G2P0010 @ 3052w4d here in MAU with complaints of DFM. She was seen today at MFM and had dopplers and BPP due to "baby measuring small".  After her appointment with MFM she has felt the baby move 4 times. BPP 8/8 today   OB History    Gravida  2   Para      Term      Preterm      AB  1   Living  0     SAB  1   TAB      Ectopic      Multiple      Live Births              Past Medical History:  Diagnosis Date  . Medical history non-contributory     Past Surgical History:  Procedure Laterality Date  . NO PAST SURGERIES      Family History  Problem Relation Age of Onset  . Hypertension Mother   . Hypertension Father   . Diabetes Maternal Grandmother   . Diabetes Paternal Grandmother     Social History   Tobacco Use  . Smoking status: Never Smoker  . Smokeless tobacco: Never Used  Substance Use Topics  . Alcohol use: No    Frequency: Never  . Drug use: No    Allergies: No Known Allergies  Medications Prior to Admission  Medication Sig Dispense Refill Last Dose  . metroNIDAZOLE (FLAGYL) 500 MG tablet Take 1 tablet (500 mg total) by mouth 2 (two) times daily. 14 tablet 0 Taking  . Prenatal Vit-Fe Fumarate-FA (PRENATAL VITAMIN PO) Take by mouth.   Taking  . terconazole (TERAZOL 7) 0.4 % vaginal cream Place 1 applicator vaginally at bedtime. 45 g 0 Taking   No results found for this or any previous visit (from the past 48 hour(s)).  [place] Mfm Fetal Bpp Wo Non Stress  Result Date: 05/06/2018 ----------------------------------------------------------------------  OBSTETRICS REPORT                      (Signed Final 05/06/2018 05:57 pm)  ---------------------------------------------------------------------- Patient Info  ID #:       [id number]                          D.O.B.:  Apr 18, 1996 (22 yrs)  Name:       Jamie Moore              Visit Date: 05/06/2018 04:05 pm ---------------------------------------------------------------------- Performed By  Performed By:     Tomma Lightningevin Vics             Ref. Address:     [address]                    [place]                                                             [place], [place]  [address]  Attending:        Noralee Space MD        Location:         [facility]  Referred By:      Alm Bustard                    MD ---------------------------------------------------------------------- Orders   #  Description                                 Code   1  [place] MFM FETAL BPP WO NON STRESS              76819.01   2  [place] MFM UA CORD DOPPLER                      76820.02   3  [place] MFM OB FOLLOW UP                         60454.09  ----------------------------------------------------------------------   #  Ordered By               Order #        Accession #    Episode #   1  Noralee Space             [id number]      [id number]     [id number]   2  RAVI SHANKAR             [id number]      [id number]     [id number]   3  RAVI DGLOVFI             [id number]      [id number]     [id number]  ---------------------------------------------------------------------- Indications   Maternal care for known or suspected poor      O36.5930   fetal growth, third trimester, not applicable or   unspecified   [redacted] weeks gestation of pregnancy                Z3A.37   Decreased fetal movement                       O36.8190  ---------------------------------------------------------------------- OB History  Blood Type:            Height:  5'3"   Weight (lb):  170       BMI:  30.11  Gravidity:    1 ---------------------------------------------------------------------- Fetal Evaluation  Num Of  Fetuses:     1  Fetal Heart         122  Rate(bpm):  Cardiac Activity:   Observed  Presentation:       Cephalic  Placenta:           Anterior  P. Cord Insertion:  Previously Visualized  Amniotic Fluid  AFI FV:      Within normal limits  AFI Sum(cm)     %Tile       Largest Pocket(cm)  15.83           60          5.66  RUQ(cm)       RLQ(cm)       LUQ(cm)        LLQ(cm)  4.83          3.01  5.66           2.33 ---------------------------------------------------------------------- Biophysical Evaluation  Amniotic F.V:   Within normal limits       F. Tone:        Observed  F. Movement:    Observed                   Score:          8/8  F. Breathing:   Observed ---------------------------------------------------------------------- Biometry  BPD:      83.4  mm     G. Age:  33w 4d          1  %    CI:        73.37   %    70 - 86                                                          FL/HC:      20.9   %    20.9 - 22.7  HC:      309.4  mm     G. Age:  34w 4d        < 3  %    HC/AC:      1.00        0.92 - 1.05  AC:      309.2  mm     G. Age:  34w 6d          6  %    FL/BPD:     77.5   %    71 - 87  FL:       64.6  mm     G. Age:  33w 2d        < 3  %    FL/AC:      20.9   %    20 - 24  HUM:      55.3  mm     G. Age:  32w 1d        < 5  %  Est. FW:    2393  gm      5 lb 4 oz   < 10  % ---------------------------------------------------------------------- Gestational Age  LMP:           37w 4d        Date:  08/16/17                 EDD:   05/23/18  U/S Today:     34w 1d                                        EDD:   06/16/18  Best:          37w 4d     Det. By:  LMP  (08/16/17)          EDD:   05/23/18 ---------------------------------------------------------------------- Doppler - Fetal Vessels  Umbilical Artery   S/D     %tile                                     PSV  ADFV    RDFV                                                   (cm/s)  2.14       39                                      38.5      No      No  ---------------------------------------------------------------------- Cervix Uterus Adnexa  Cervix  Not visualized (advanced GA >24wks) ---------------------------------------------------------------------- Impression  Suspected IUGR. Patient returned for ultrasound evaluation.  She complained of decreased fetal movements.  The estimated fetal weight is at less than the 10th percentile.  Amniotic fluid is normal and good fetal activity is seen.  Umbilical artery Doppler showed normal diastolic flow.  Antenatal testing is reassuring. BPP 8/8.  I explained the finding and informed her that it is difficult to  differentiate between a constitutionally-small fetus and the  one with growth restriction.  Timing of delivery: ACOG recommends delivery between 38w  0d till 39w 6d gestation provided antenatal testing is  reassuring. Long-term consequences including neurological  outcomes cannot be predicted on the basis of antenatal  testing.  Patient was taken to the MAU for NST. ---------------------------------------------------------------------- Recommendations  -Antenatal testing next week if undelivered.  -If NST is not reactive (at MAU) and patient still has  decreased fetal movements, I recommend continuing  inpatient management with an intention to deliver. Ultrasound  (BPP and Doppler studies) has limitations in predicting fetal  compromise. ----------------------------------------------------------------------                  Noralee Spaceavi Shankar, MD Electronically Signed Final Report   05/06/2018 05:57 pm ----------------------------------------------------------------------  [place] Mfm Ob Follow Up  Result Date: 05/06/2018 ----------------------------------------------------------------------  OBSTETRICS REPORT                      (Signed Final 05/06/2018 05:57 pm) ---------------------------------------------------------------------- Patient Info  ID #:       [id number]                          D.O.B.:  1996/01/09 (22  yrs)  Name:       Jamie Moore              Visit Date: 05/06/2018 04:05 pm ---------------------------------------------------------------------- Performed By  Performed By:     Tomma Lightningevin Vics             Ref. Address:     [address]                    [place]                                                             [place], [place]  [address]  Attending:        Noralee Space MD        Location:         [facility]  Referred By:      Alm Bustard                    MD ---------------------------------------------------------------------- Orders   #  Description                                 Code   1  [place] MFM FETAL BPP WO NON STRESS              76819.01   2  [place] MFM UA CORD DOPPLER                      76820.02   3  [place] MFM OB FOLLOW UP                         60454.09  ----------------------------------------------------------------------   #  Ordered By               Order #        Accession #    Episode #   1  Noralee Space             [id number]      [id number]     [id number]   2  RAVI SHANKAR             [id number]      [id number]     [id number]   3  RAVI DGLOVFI             [id number]      [id number]     [id number]  ---------------------------------------------------------------------- Indications   Maternal care for known or suspected poor      O36.5930   fetal growth, third trimester, not applicable or   unspecified   [redacted] weeks gestation of pregnancy                Z3A.37   Decreased fetal movement                       O36.8190  ---------------------------------------------------------------------- OB History  Blood Type:            Height:  5'3"   Weight (lb):  170       BMI:  30.11  Gravidity:    1 ---------------------------------------------------------------------- Fetal Evaluation  Num Of Fetuses:     1  Fetal Heart         122  Rate(bpm):  Cardiac Activity:   Observed  Presentation:       Cephalic  Placenta:           Anterior  P. Cord  Insertion:  Previously Visualized  Amniotic Fluid  AFI FV:      Within normal limits  AFI Sum(cm)     %Tile       Largest Pocket(cm)  15.83           60          5.66  RUQ(cm)       RLQ(cm)       LUQ(cm)        LLQ(cm)  4.83          3.01  5.66           2.33 ---------------------------------------------------------------------- Biophysical Evaluation  Amniotic F.V:   Within normal limits       F. Tone:        Observed  F. Movement:    Observed                   Score:          8/8  F. Breathing:   Observed ---------------------------------------------------------------------- Biometry  BPD:      83.4  mm     G. Age:  33w 4d          1  %    CI:        73.37   %    70 - 86                                                          FL/HC:      20.9   %    20.9 - 22.7  HC:      309.4  mm     G. Age:  34w 4d        < 3  %    HC/AC:      1.00        0.92 - 1.05  AC:      309.2  mm     G. Age:  34w 6d          6  %    FL/BPD:     77.5   %    71 - 87  FL:       64.6  mm     G. Age:  33w 2d        < 3  %    FL/AC:      20.9   %    20 - 24  HUM:      55.3  mm     G. Age:  32w 1d        < 5  %  Est. FW:    2393  gm      5 lb 4 oz   < 10  % ---------------------------------------------------------------------- Gestational Age  LMP:           37w 4d        Date:  08/16/17                 EDD:   05/23/18  U/S Today:     34w 1d                                        EDD:   06/16/18  Best:          37w 4d     Det. By:  LMP  (08/16/17)          EDD:   05/23/18 ---------------------------------------------------------------------- Doppler - Fetal Vessels  Umbilical Artery   S/D     %tile                                     PSV  ADFV    RDFV                                                   (cm/s)  2.14       39                                      38.5      No      No ---------------------------------------------------------------------- Cervix Uterus Adnexa  Cervix  Not visualized (advanced GA >24wks)  ---------------------------------------------------------------------- Impression  Suspected IUGR. Patient returned for ultrasound evaluation.  She complained of decreased fetal movements.  The estimated fetal weight is at less than the 10th percentile.  Amniotic fluid is normal and good fetal activity is seen.  Umbilical artery Doppler showed normal diastolic flow.  Antenatal testing is reassuring. BPP 8/8.  I explained the finding and informed her that it is difficult to  differentiate between a constitutionally-small fetus and the  one with growth restriction.  Timing of delivery: ACOG recommends delivery between 38w  0d till 39w 6d gestation provided antenatal testing is  reassuring. Long-term consequences including neurological  outcomes cannot be predicted on the basis of antenatal  testing.  Patient was taken to the MAU for NST. ---------------------------------------------------------------------- Recommendations  -Antenatal testing next week if undelivered.  -If NST is not reactive (at MAU) and patient still has  decreased fetal movements, I recommend continuing  inpatient management with an intention to deliver. Ultrasound  (BPP and Doppler studies) has limitations in predicting fetal  compromise. ----------------------------------------------------------------------                  Noralee Space, MD Electronically Signed Final Report   05/06/2018 05:57 pm ----------------------------------------------------------------------  [place] Mfm Ua Cord Doppler  Result Date: 05/06/2018 ----------------------------------------------------------------------  OBSTETRICS REPORT                      (Signed Final 05/06/2018 05:57 pm) ---------------------------------------------------------------------- Patient Info  ID #:       [id number]                          D.O.B.:  06/30/1996 (22 yrs)  Name:       Jamie Moore              Visit Date: 05/06/2018 04:05 pm  ---------------------------------------------------------------------- Performed By  Performed By:     Tomma Lightning             Ref. Address:     [address]                    [place]                                                             [place], [place]  [address]  Attending:        Noralee Space MD        Location:         [facility]  Referred By:      Alm Bustard                    MD ---------------------------------------------------------------------- Orders   #  Description                                 Code   1  [place] MFM FETAL BPP WO NON STRESS              76819.01   2  [place] MFM UA CORD DOPPLER                      76820.02   3  [place] MFM OB FOLLOW UP                         60454.09  ----------------------------------------------------------------------   #  Ordered By               Order #        Accession #    Episode #   1  Noralee Space             [id number]      [id number]     [id number]   2  RAVI SHANKAR             [id number]      [id number]     [id number]   3  RAVI DGLOVFI             [id number]      [id number]     [id number]  ---------------------------------------------------------------------- Indications   Maternal care for known or suspected poor      O36.5930   fetal growth, third trimester, not applicable or   unspecified   [redacted] weeks gestation of pregnancy                Z3A.37   Decreased fetal movement                       O36.8190  ---------------------------------------------------------------------- OB History  Blood Type:            Height:  5'3"   Weight (lb):  170       BMI:  30.11  Gravidity:    1 ---------------------------------------------------------------------- Fetal Evaluation  Num Of Fetuses:     1  Fetal Heart         122  Rate(bpm):  Cardiac Activity:   Observed  Presentation:       Cephalic  Placenta:           Anterior  P. Cord Insertion:  Previously Visualized  Amniotic Fluid  AFI FV:      Within normal limits   AFI Sum(cm)     %Tile       Largest Pocket(cm)  15.83           60          5.66  RUQ(cm)       RLQ(cm)       LUQ(cm)        LLQ(cm)  4.83          3.01  5.66           2.33 ---------------------------------------------------------------------- Biophysical Evaluation  Amniotic F.V:   Within normal limits       F. Tone:        Observed  F. Movement:    Observed                   Score:          8/8  F. Breathing:   Observed ---------------------------------------------------------------------- Biometry  BPD:      83.4  mm     G. Age:  33w 4d          1  %    CI:        73.37   %    70 - 86                                                          FL/HC:      20.9   %    20.9 - 22.7  HC:      309.4  mm     G. Age:  34w 4d        < 3  %    HC/AC:      1.00        0.92 - 1.05  AC:      309.2  mm     G. Age:  34w 6d          6  %    FL/BPD:     77.5   %    71 - 87  FL:       64.6  mm     G. Age:  33w 2d        < 3  %    FL/AC:      20.9   %    20 - 24  HUM:      55.3  mm     G. Age:  32w 1d        < 5  %  Est. FW:    2393  gm      5 lb 4 oz   < 10  % ---------------------------------------------------------------------- Gestational Age  LMP:           37w 4d        Date:  08/16/17                 EDD:   05/23/18  U/S Today:     34w 1d                                        EDD:   06/16/18  Best:          37w 4d     Det. By:  LMP  (08/16/17)          EDD:   05/23/18 ---------------------------------------------------------------------- Doppler - Fetal Vessels  Umbilical Artery   S/D     %tile                                     PSV  ADFV    RDFV                                                   (cm/s)  2.14       39                                      38.5      No      No ---------------------------------------------------------------------- Cervix Uterus Adnexa  Cervix  Not visualized (advanced GA >24wks) ---------------------------------------------------------------------- Impression  Suspected IUGR. Patient  returned for ultrasound evaluation.  She complained of decreased fetal movements.  The estimated fetal weight is at less than the 10th percentile.  Amniotic fluid is normal and good fetal activity is seen.  Umbilical artery Doppler showed normal diastolic flow.  Antenatal testing is reassuring. BPP 8/8.  I explained the finding and informed her that it is difficult to  differentiate between a constitutionally-small fetus and the  one with growth restriction.  Timing of delivery: ACOG recommends delivery between 38w  0d till 39w 6d gestation provided antenatal testing is  reassuring. Long-term consequences including neurological  outcomes cannot be predicted on the basis of antenatal  testing.  Patient was taken to the MAU for NST. ---------------------------------------------------------------------- Recommendations  -Antenatal testing next week if undelivered.  -If NST is not reactive (at MAU) and patient still has  decreased fetal movements, I recommend continuing  inpatient management with an intention to deliver. Ultrasound  (BPP and Doppler studies) has limitations in predicting fetal  compromise. ----------------------------------------------------------------------                  Noralee Space, MD Electronically Signed Final Report   05/06/2018 05:57 pm ----------------------------------------------------------------------  Review of Systems  Constitutional: Negative for fever.  Gastrointestinal: Negative for abdominal pain.  Genitourinary: Negative for vaginal bleeding, vaginal discharge and vaginal pain.   Physical Exam   Blood pressure 103/84, pulse (!) 102, temperature 98.4 F (36.9 C), temperature source Oral, resp. rate 18, weight 86.2 kg, last menstrual period 08/16/2017.  Physical Exam  Constitutional: She is oriented to person, place, and time. She appears well-developed and well-nourished. No distress.  HENT:  Head: Normocephalic.  Eyes: Pupils are equal, round, and reactive to  light.  GI: Soft. She exhibits no distension. There is no tenderness. There is no rebound.  Neurological: She is alert and oriented to person, place, and time.  Skin: Skin is warm. She is not diaphoretic.  Psychiatric: Her behavior is normal.   Fetal Tracing: Baseline: 140 bpm Variability: Moderate  Accelerations: 15x15 Decelerations: None Toco: None  MAU Course  Procedures  None  MDM  BPP 8/8 NST reactive today Patient was given Azithromycin in MAU  1 gram PO  Assessment and Plan   A:  1. Decreased fetal movement affecting management of pregnancy, antepartum, single or unspecified fetus   2. NST (non-stress test) reactive   3. Chlamydia infection     P:  Discharge home in stable condition Return to MAU if symptoms worsen Follow up with OB as scheduled F/U with MFM as scheduled  Kick counts  Jamie Moore, Harolyn Rutherford, NP 05/07/2018 8:30 PM

## 2018-05-06 NOTE — ED Notes (Signed)
Pt here in [facility] for UA dopplers and BPP.  Pt reports baby hasn't been moving as much.  Last movement was today at 1000. Pt reports when the baby moves, he/she moves 4-6/hour.  Pt denies any U/C's, vaginal bleeding or leaking of fluid.

## 2018-05-07 ENCOUNTER — Other Ambulatory Visit (HOSPITAL_COMMUNITY): Payer: Self-pay | Admitting: *Deleted

## 2018-05-07 DIAGNOSIS — O365930 Maternal care for other known or suspected poor fetal growth, third trimester, not applicable or unspecified: Secondary | ICD-10-CM

## 2018-05-13 ENCOUNTER — Encounter (HOSPITAL_COMMUNITY): Payer: Self-pay

## 2018-05-13 ENCOUNTER — Ambulatory Visit (HOSPITAL_COMMUNITY)
Admission: RE | Admit: 2018-05-13 | Discharge: 2018-05-13 | Disposition: A | Discharge status: Home / Self Care | Payer: Managed Care, Other (non HMO) | Source: Ambulatory Visit | Attending: Specialist | Admitting: Specialist

## 2018-05-13 DIAGNOSIS — Z3A38 38 weeks gestation of pregnancy: Secondary | ICD-10-CM | POA: Insufficient documentation

## 2018-05-13 DIAGNOSIS — O365930 Maternal care for other known or suspected poor fetal growth, third trimester, not applicable or unspecified: Secondary | ICD-10-CM | POA: Insufficient documentation

## 2018-05-19 ENCOUNTER — Encounter: Payer: Self-pay | Admitting: Certified Nurse Midwife

## 2018-05-19 ENCOUNTER — Ambulatory Visit (INDEPENDENT_AMBULATORY_CARE_PROVIDER_SITE_OTHER): Payer: Managed Care, Other (non HMO) | Admitting: Certified Nurse Midwife

## 2018-05-19 ENCOUNTER — Encounter (HOSPITAL_COMMUNITY): Payer: Self-pay | Admitting: *Deleted

## 2018-05-19 ENCOUNTER — Inpatient Hospital Stay (HOSPITAL_COMMUNITY)
Admission: AD | Admit: 2018-05-19 | Discharge: 2018-05-23 | DRG: 807 | Disposition: A | Discharge status: Home / Self Care | Payer: Managed Care, Other (non HMO) | Source: Ambulatory Visit | Attending: Obstetrics & Gynecology | Admitting: Obstetrics & Gynecology

## 2018-05-19 VITALS — BP 107/75 | HR 102 | Wt 193.0 lb

## 2018-05-19 DIAGNOSIS — O98319 Other infections with a predominantly sexual mode of transmission complicating pregnancy, unspecified trimester: Secondary | ICD-10-CM | POA: Diagnosis present

## 2018-05-19 DIAGNOSIS — A749 Chlamydial infection, unspecified: Secondary | ICD-10-CM

## 2018-05-19 DIAGNOSIS — Z3A39 39 weeks gestation of pregnancy: Secondary | ICD-10-CM

## 2018-05-19 DIAGNOSIS — O98819 Other maternal infectious and parasitic diseases complicating pregnancy, unspecified trimester: Secondary | ICD-10-CM

## 2018-05-19 DIAGNOSIS — Z8249 Family history of ischemic heart disease and other diseases of the circulatory system: Secondary | ICD-10-CM | POA: Diagnosis not present

## 2018-05-19 DIAGNOSIS — O99824 Streptococcus B carrier state complicating childbirth: Secondary | ICD-10-CM | POA: Diagnosis present

## 2018-05-19 DIAGNOSIS — Z833 Family history of diabetes mellitus: Secondary | ICD-10-CM

## 2018-05-19 DIAGNOSIS — O365930 Maternal care for other known or suspected poor fetal growth, third trimester, not applicable or unspecified: Principal | ICD-10-CM | POA: Diagnosis present

## 2018-05-19 DIAGNOSIS — O365990 Maternal care for other known or suspected poor fetal growth, unspecified trimester, not applicable or unspecified: Secondary | ICD-10-CM | POA: Diagnosis present

## 2018-05-19 DIAGNOSIS — A562 Chlamydial infection of genitourinary tract, unspecified: Secondary | ICD-10-CM | POA: Diagnosis present

## 2018-05-19 DIAGNOSIS — O98813 Other maternal infectious and parasitic diseases complicating pregnancy, third trimester: Secondary | ICD-10-CM

## 2018-05-19 DIAGNOSIS — O0993 Supervision of high risk pregnancy, unspecified, third trimester: Secondary | ICD-10-CM | POA: Insufficient documentation

## 2018-05-19 LAB — HEMOGLOBIN A1C
Hgb A1c MFr Bld: 5 % (ref 4.8–5.6)
Mean Plasma Glucose: 96.8 mg/dL

## 2018-05-19 LAB — CBC
HEMATOCRIT: 36.9 % (ref 36.0–46.0)
HEMOGLOBIN: 12.2 g/dL (ref 12.0–15.0)
MCH: 29 pg (ref 26.0–34.0)
MCHC: 33.1 g/dL (ref 30.0–36.0)
MCV: 87.6 fL (ref 78.0–100.0)
Platelets: 180 10*3/uL (ref 150–400)
RBC: 4.21 MIL/uL (ref 3.87–5.11)
RDW: 14.4 % (ref 11.5–15.5)
WBC: 9.6 10*3/uL (ref 4.0–10.5)

## 2018-05-19 LAB — TYPE AND SCREEN
ABO/RH(D): O POS
Antibody Screen: NEGATIVE

## 2018-05-19 LAB — GROUP B STREP BY PCR: Group B strep by PCR: POSITIVE — AB

## 2018-05-19 LAB — ABO/RH: ABO/RH(D): O POS

## 2018-05-19 MED ORDER — OXYTOCIN BOLUS FROM INFUSION
500.0000 mL | Freq: Once | INTRAVENOUS | Status: AC
  Administered 2018-05-21: 500 mL via INTRAVENOUS

## 2018-05-19 MED ORDER — ONDANSETRON HCL 4 MG/2ML IJ SOLN
4.0000 mg | Freq: Four times a day (QID) | INTRAMUSCULAR | Status: DC | PRN
  Administered 2018-05-20 (×2): 4 mg via INTRAVENOUS
  Filled 2018-05-19 (×2): qty 2

## 2018-05-19 MED ORDER — LIDOCAINE HCL (PF) 1 % IJ SOLN
30.0000 mL | INTRAMUSCULAR | Status: DC | PRN
  Filled 2018-05-19 (×2): qty 30

## 2018-05-19 MED ORDER — SODIUM CHLORIDE 0.9 % IV SOLN
5.0000 10*6.[IU] | Freq: Once | INTRAVENOUS | Status: AC
  Administered 2018-05-19: 5 10*6.[IU] via INTRAVENOUS
  Filled 2018-05-19: qty 5

## 2018-05-19 MED ORDER — LACTATED RINGERS IV SOLN
INTRAVENOUS | Status: DC
  Administered 2018-05-20 – 2018-05-21 (×4): via INTRAVENOUS

## 2018-05-19 MED ORDER — SOD CITRATE-CITRIC ACID 500-334 MG/5ML PO SOLN: 30.0000 mL | ORAL | Status: DC | PRN

## 2018-05-19 MED ORDER — OXYTOCIN 40 UNITS IN LACTATED RINGERS INFUSION - SIMPLE MED
1.0000 m[IU]/min | INTRAVENOUS | Status: DC
  Administered 2018-05-19: 1 m[IU]/min via INTRAVENOUS
  Administered 2018-05-20: 2 m[IU]/min via INTRAVENOUS
  Administered 2018-05-20: 17 m[IU]/min via INTRAVENOUS
  Filled 2018-05-19 (×3): qty 1000

## 2018-05-19 MED ORDER — ACETAMINOPHEN 325 MG PO TABS
650.0000 mg | ORAL_TABLET | ORAL | Status: DC | PRN
  Administered 2018-05-20: 650 mg via ORAL
  Filled 2018-05-19: qty 2

## 2018-05-19 MED ORDER — TERBUTALINE SULFATE 1 MG/ML IJ SOLN
0.2500 mg | Freq: Once | INTRAMUSCULAR | Status: DC | PRN
  Filled 2018-05-19: qty 1

## 2018-05-19 MED ORDER — FENTANYL CITRATE (PF) 100 MCG/2ML IJ SOLN
50.0000 ug | INTRAMUSCULAR | Status: DC | PRN
  Administered 2018-05-19 – 2018-05-20 (×3): 50 ug via INTRAVENOUS
  Filled 2018-05-19 (×3): qty 2

## 2018-05-19 MED ORDER — OXYCODONE-ACETAMINOPHEN 5-325 MG PO TABS: 1.0000 | ORAL_TABLET | ORAL | Status: DC | PRN

## 2018-05-19 MED ORDER — OXYCODONE-ACETAMINOPHEN 5-325 MG PO TABS: 2.0000 | ORAL_TABLET | ORAL | Status: DC | PRN

## 2018-05-19 MED ORDER — OXYTOCIN 40 UNITS IN LACTATED RINGERS INFUSION - SIMPLE MED: 2.5000 [IU]/h | INTRAVENOUS | Status: DC

## 2018-05-19 MED ORDER — LACTATED RINGERS IV SOLN: 500.0000 mL | INTRAVENOUS | Status: DC | PRN

## 2018-05-19 MED ORDER — PENICILLIN G POTASSIUM 5000000 UNITS IJ SOLR
2.5000 10*6.[IU] | INTRAVENOUS | Status: DC
  Filled 2018-05-19: qty 2.5

## 2018-05-19 MED ORDER — PENICILLIN G 3 MILLION UNITS IVPB - SIMPLE MED
3.0000 10*6.[IU] | INTRAVENOUS | Status: DC
  Administered 2018-05-20 – 2018-05-21 (×9): 3 10*6.[IU] via INTRAVENOUS
  Filled 2018-05-19: qty 100
  Filled 2018-05-19 (×3): qty 3
  Filled 2018-05-19: qty 100
  Filled 2018-05-19 (×2): qty 3
  Filled 2018-05-19: qty 100
  Filled 2018-05-19 (×2): qty 3
  Filled 2018-05-19: qty 100
  Filled 2018-05-19: qty 3
  Filled 2018-05-19 (×3): qty 100
  Filled 2018-05-19: qty 3

## 2018-05-19 NOTE — H&P (Addendum)
OBSTETRIC ADMISSION HISTORY AND PHYSICAL  Jamie Moore is a 22 y.o. female G2P0010 with IUP at [redacted]w[redacted]d by L+5 week [place] presenting for IOL due to IUGR. Hx of first trimester miscarriage. Patient reports that she has been receiving prenatal care in [place] but recently moved back to [place] so is moving her care here. Reports HA (last Sunday) Reports fetal movement and irregular contractions. Denies vaginal bleeding or loss of fluid.   She received her prenatal care at [facility], [facility].  Support person in labor: FOB  Ultrasounds . Anatomy U/S: 03/19/2018 Impression  Single living intrauterine pregnancy at 30w 5d.  Placenta Anterior, above cervical os.  EFW 15th%'le with AC <3rd%'le is consistent with IUGR  Normal amniotic fluid volume.  The fetal anatomic survey is not complete.  No gross fetal anomalies identified.  UA Doppler studies are normal  BPP 8/8  The cervix measures 3.44cm transabdominally without  funneling.  The adnexa appear normal bilaterally without masses.  Prenatal History/Complications: . Hx early term pregnancy loss in past  . IUGR <10 percentile this pregnancy  . Hx Chlamydia this pregnancy (8/12)   Past Medical History: Past Medical History:  Diagnosis Date  . Medical history non-contributory     Past Surgical History: Past Surgical History:  Procedure Laterality Date  . NO PAST SURGERIES      Obstetrical History: OB History    Gravida  2   Para      Term      Preterm      AB  1   Living  0     SAB  1   TAB      Ectopic      Multiple      Live Births              Social History: Social History   Socioeconomic History  . Marital status: Single    Spouse name: Not on file  . Number of children: Not on file  . Years of education: Not on file  . Highest education level: Not on file  Occupational History  . Not on file  Social Needs  . Financial resource strain: Not hard at all  . Food insecurity:    Worry: Never  true    Inability: Never true  . Transportation needs:    Medical: No    Non-medical: No  Tobacco Use  . Smoking status: Never Smoker  . Smokeless tobacco: Never Used  Substance and Sexual Activity  . Alcohol use: No    Frequency: Never  . Drug use: No  . Sexual activity: Yes    Birth control/protection: None  Lifestyle  . Physical activity:    Days per week: Not on file    Minutes per session: Not on file  . Stress: Not at all  Relationships  . Social connections:    Talks on phone: More than three times a week    Gets together: More than three times a week    Attends religious service: 1 to 4 times per year    Active member of club or organization: No    Attends meetings of clubs or organizations: Never    Relationship status: Patient refused  Other Topics Concern  . Not on file  Social History Narrative  . Not on file    Family History: Family History  Problem Relation Age of Onset  . Hypertension Mother   . Hypertension Father   . Diabetes Maternal Grandmother   .  Diabetes Paternal Grandmother     Allergies: No Known Allergies  Medications Prior to Admission  Medication Sig Dispense Refill Last Dose  . Prenatal Vit-Fe Fumarate-FA (PRENATAL VITAMIN PO) Take by mouth.   Past Week at Unknown time  . metroNIDAZOLE (FLAGYL) 500 MG tablet Take 1 tablet (500 mg total) by mouth 2 (two) times daily. 14 tablet 0 Taking  . terconazole (TERAZOL 7) 0.4 % vaginal cream Place 1 applicator vaginally at bedtime. 45 g 0 Taking     Review of Systems  Review of Systems  Constitutional: Negative for chills and fever.  HENT: Negative for tinnitus.   Eyes: Negative for blurred vision.  Respiratory: Negative for cough and shortness of breath.   Cardiovascular: Negative for chest pain and leg swelling.  Gastrointestinal: Negative for abdominal pain, constipation, diarrhea, heartburn, nausea and vomiting.  Genitourinary: Positive for frequency. Negative for dysuria and  hematuria.  Skin: Negative for rash.  Neurological: Positive for headaches (for the past few months, have been discussed with physician in the past. Not relieved by medications. Last occured last Sunday. ).  Psychiatric/Behavioral: Negative for depression, substance abuse and suicidal ideas. The patient is not nervous/anxious.      Height 5\' 4"  (1.626 m), weight 87.5 kg, last menstrual period 08/16/2017. General appearance: alert, cooperative and no distress Lungs: no respiratory distress Heart: regular rate  Abdomen: soft, non-tender; gravid b Extremities: Homans sign is negative, no sign of DVT Presentation: cephalic by Leopold's  Fetal monitoring: Moderate variability, pos accelerations, no decelerations Uterine activity: Contractions every 2-3 minutes     Prenatal labs: ABO, Rh:  O POS  Antibody:  Neg Rubella:  Pending  RPR:   Pending  HBsAg:   Pending  HIV:   Pending  GBS:   Positive  Glucola: No hx of Glucola found, A1C pending  Genetic screening:  None   Prenatal Transfer Tool   Fetal Ultrasounds or other Referrals:  Referred to Materal Fetal Medicine  Maternal Substance Abuse:  No Significant Maternal Medications:  None Significant Maternal Lab Results: Lab values include: Group B Strep positive  Results for orders placed or performed during the hospital encounter of 05/19/18 (from the past 24 hour(s))  CBC   Collection Time: 05/19/18  3:40 PM  Result Value Ref Range   WBC 9.6 4.0 - 10.5 K/uL   RBC 4.21 3.87 - 5.11 MIL/uL   Hemoglobin 12.2 12.0 - 15.0 g/dL   HCT 16.1 09.6 - 04.5 %   MCV 87.6 78.0 - 100.0 fL   MCH 29.0 26.0 - 34.0 pg   MCHC 33.1 30.0 - 36.0 g/dL   RDW 40.9 81.1 - 91.4 %   Platelets 180 150 - 400 K/uL    Patient Active Problem List   Diagnosis Date Noted  . Supervision of high risk pregnancy, antepartum, third trimester 05/19/2018  . Chlamydia infection during pregnancy 05/19/2018  . IUGR (intrauterine growth restriction) affecting care of  mother 05/19/2018  . Poor fetal growth affecting management of mother in third trimester   . [redacted] weeks gestation of pregnancy   . Encounter for fetal anatomic survey     Assessment/Plan:  Jamie Moore is a 22 y.o. G2P0010 at [redacted]w[redacted]d here for IOL because of IUGR <10 percentile. Patient has limited prenatal testing. PMH of first trimester miscarriage, Chlamydia this pregnancy, BV this pregnancy.   Limited/unavailable prenatal labs -- HA1C, Rubella, Hep B, GBS, GC/Chlamydia, HIV antibody, RPR, CBC ordered  -- Positive Chlamydia this pregnancy (8/12) was treated;  GC/Chamydia screen pending  IUGR -- US showed <10 percentile  --  Inducing labor   Labor: Progressing -- pain control: Plans for epidural   Fetal Wellbeing: EFW 6-7 LBS by Leopold's. Cephalic by US (8/22).  -- GBS (+) -- continuous fetal monitoring - Not warranted at this time    Postpartum Planning -- breast -- Wants birth control, unsure of which type. Will discuss further.   Charna ElizabethJohn Victor Cresenzo, MS4 [employer]    CNM attestation:  I have seen and examined this patient; I agree with above documentation in the medical student's note.   Jamie Moore is a 22 y.o. G2P0010 at 4323w3d here for IOL 2/2 IUGR. +FM, denies LOF, VB, contractions, vaginal discharge.  Patient with limited prenatal care, and OB labs pending.   PE: BP 107/72   Pulse 78   Resp 16   Ht 5\' 4"  (1.626 m)   Wt 193 lb (87.5 kg)   LMP 08/16/2017 (Exact Date)   BMI 33.13 kg/m  Gen: calm comfortable, NAD Resp: normal effort, no distress Abd: gravid  Attempted to place foley with manual insertion. Unable. Patient placed in lithotomy and speculum/cook catheter with stylet used. Uterine balloon filled with 60 cc water and vaginal balloon filled with 60 cc water.   ROS, labs, PMH reviewed  NST:  Baseline: 140 Variability: moderate Accels: 15x15 Decels: none Toco: 2-3 mins    Plan: Admit to labor and delivery FB with  low dose pitocin Anticipate NSVD.   Thressa ShellerHeather Hogan, CNM 6:32 PM

## 2018-05-19 NOTE — Progress Notes (Signed)
Subjective:   Jamie Moore is a 22 y.o. G2P0010 at 3138w3d by LMP being seen today for her first obstetrical visit.  Her obstetrical history is significant for intrauterine growth restriction (IUGR). Patient does intend to breast feed. Pregnancy history fully reviewed.  Patient reports no complaints.  HISTORY: OB History  Gravida Para Term Preterm AB Living  2 0 0 0 1 0  SAB TAB Ectopic Multiple Live Births  1 0 0 0 0    # Outcome Date GA Lbr Len/2nd Weight Sex Delivery Anes PTL Lv  2 Current           1 SAB             Last pap smear was done 02/2016 and was normal  Past Medical History:  Diagnosis Date  . Medical history non-contributory    Past Surgical History:  Procedure Laterality Date  . NO PAST SURGERIES     Family History  Problem Relation Age of Onset  . Hypertension Mother   . Hypertension Father   . Diabetes Maternal Grandmother   . Diabetes Paternal Grandmother    Social History   Tobacco Use  . Smoking status: Never Smoker  . Smokeless tobacco: Never Used  Substance Use Topics  . Alcohol use: No    Frequency: Never  . Drug use: No   No Known Allergies Current Outpatient Medications on File Prior to Visit  Medication Sig Dispense Refill  . Prenatal Vit-Fe Fumarate-FA (PRENATAL VITAMIN PO) Take by mouth.    . metroNIDAZOLE (FLAGYL) 500 MG tablet Take 1 tablet (500 mg total) by mouth 2 (two) times daily. 14 tablet 0  . terconazole (TERAZOL 7) 0.4 % vaginal cream Place 1 applicator vaginally at bedtime. 45 g 0   No current facility-administered medications on file prior to visit.     Review of Systems Pertinent items noted in HPI and remainder of comprehensive ROS otherwise negative.  Exam   Vitals:   05/19/18 1409  BP: 107/75  Pulse: (!) 102  Weight: 193 lb (87.5 kg)   Fetal Heart Rate (bpm): 142  Uterus:  Fundal Height: 33 cm  System: General: well-developed, well-nourished female in no acute distress   Skin: normal coloration  and turgor, no rashes   Neurologic: oriented, normal, negative, normal mood   Extremities: normal strength, tone, and muscle mass, ROM of all joints is normal   HEENT PERRLA, extraocular movement intact and sclera clear, anicteric   Mouth/Teeth mucous membranes moist, pharynx normal without lesions and dental hygiene good   Neck supple and no masses   Cardiovascular: regular rate and rhythm   Respiratory:  no respiratory distress, normal breath sounds   Abdomen: soft, gravid small for gestational age     Assessment:   Pregnancy: G2P0010 Patient Active Problem List   Diagnosis Date Noted  . Supervision of high risk pregnancy, antepartum, third trimester 05/19/2018  . Chlamydia infection during pregnancy 05/19/2018  . Poor fetal growth affecting management of mother in third trimester   . [redacted] weeks gestation of pregnancy   . Encounter for fetal anatomic survey      Plan:  1. Supervision of high risk pregnancy, antepartum, third trimester -[facility]38[facility] -Patient reports close f/u with MFM due to suspected IUGR -Patient reports being told she was to be delivered at 39 weeks by MFM -Patient doing well, denies contractions but is concerned about baby not being delivered  2. Poor fetal growth affecting management of mother in third trimester, single or unspecified fetus -Growth [place] on 8/15 showed baby was measuring [redacted]w[redacted]d with EFW 2393gm (5lb 4oz) <10%  -Weekly antenatal testing and dopplers normal  -Doppler on 8/22 2.17 in 44%tile with 8/8 BPP  3. Chlamydia infection during pregnancy -Dx on 8/12, tx on 8/15- needs TOC and GBS PCR    Consulted with Dr Ashok Pall about patient and need for direct admission to L&D for IOL for IUGR. Dr Ashok Pall agrees with plan of care. NICU aware of admission to L&D. Labor team notified   GBS PCR and GC/C TOC needed at beginning of induction process- labor team aware.  Discussed plan of care with patient, discussed process  of induction, answered patient questions and walked to registration to be admitted.  Return in about 30 days (around 06/18/2018) for POSTPARTUM.  Sharyon Cable, CNM 05/19/18, 2:57 PM

## 2018-05-19 NOTE — Progress Notes (Signed)
OB/GYN Faculty Practice: Labor Progress Note  Subjective: Doing well, NAD. Thought FB had come out but still in place.   Objective: BP 123/78   Pulse 100   Resp 18   Ht 5\' 4"  (1.626 m)   Wt 87.5 kg   LMP 08/16/2017 (Exact Date)   BMI 33.13 kg/m  Gen: well-appearing, family in room Dilation: 1 Effacement (%): Thick Station: -3 Presentation: Vertex Exam by:: J.Thornton, rnc  Assessment and Plan: 22 y.o. G2P0010 8366w3d here for IOL for IUGR.  Induction of Labor IUGR: Started induction 05/19/18 at 1600. -- FB in place -- low-dose pitocin -- pain control: undecided   Fetal Well-Being: EFW 2393g <10%, AC 6% with normal dopplers. Cephalic by BSUS.  -- Category I - continuous fetal monitoring  -- GBS (+) - will start PCN   Evamarie Raetz S. Earlene PlaterWallace, DO OB/GYN Fellow, Faculty Practice  6:53 PM

## 2018-05-20 ENCOUNTER — Inpatient Hospital Stay (HOSPITAL_COMMUNITY): Payer: Managed Care, Other (non HMO) | Admitting: Anesthesiology

## 2018-05-20 ENCOUNTER — Other Ambulatory Visit: Payer: Self-pay

## 2018-05-20 LAB — RUBELLA SCREEN: RUBELLA: 6 {index} (ref >0.99)

## 2018-05-20 LAB — HEPATITIS B SURFACE ANTIGEN: HEP B S AG: NEGATIVE

## 2018-05-20 LAB — HIV ANTIBODY (ROUTINE TESTING W REFLEX): HIV Screen 4th Generation wRfx: NONREACTIVE

## 2018-05-20 LAB — RPR: RPR: NONREACTIVE

## 2018-05-20 MED ORDER — PHENYLEPHRINE 40 MCG/ML (10ML) SYRINGE FOR IV PUSH (FOR BLOOD PRESSURE SUPPORT)
80.0000 ug | PREFILLED_SYRINGE | INTRAVENOUS | Status: DC | PRN
  Filled 2018-05-20: qty 5

## 2018-05-20 MED ORDER — FENTANYL CITRATE (PF) 100 MCG/2ML IJ SOLN
INTRAMUSCULAR | Status: AC
  Administered 2018-05-20: 100 ug via INTRAVENOUS
  Filled 2018-05-20: qty 2

## 2018-05-20 MED ORDER — DIPHENHYDRAMINE HCL 50 MG/ML IJ SOLN
12.5000 mg | INTRAMUSCULAR | Status: DC | PRN
  Administered 2018-05-20 – 2018-05-21 (×2): 12.5 mg via INTRAVENOUS
  Filled 2018-05-20 (×2): qty 1

## 2018-05-20 MED ORDER — LACTATED RINGERS IV SOLN: 500.0000 mL | Freq: Once | INTRAVENOUS | Status: DC

## 2018-05-20 MED ORDER — FENTANYL CITRATE (PF) 100 MCG/2ML IJ SOLN
100.0000 ug | INTRAMUSCULAR | Status: DC | PRN
  Administered 2018-05-20 (×2): 100 ug via INTRAVENOUS
  Filled 2018-05-20: qty 2

## 2018-05-20 MED ORDER — ZOLPIDEM TARTRATE 5 MG PO TABS
5.0000 mg | ORAL_TABLET | Freq: Every evening | ORAL | Status: AC | PRN
  Administered 2018-05-21: 5 mg via ORAL
  Filled 2018-05-20: qty 1

## 2018-05-20 MED ORDER — PHENYLEPHRINE 40 MCG/ML (10ML) SYRINGE FOR IV PUSH (FOR BLOOD PRESSURE SUPPORT)
80.0000 ug | PREFILLED_SYRINGE | INTRAVENOUS | Status: DC | PRN
  Filled 2018-05-20: qty 10
  Filled 2018-05-20: qty 5

## 2018-05-20 MED ORDER — EPHEDRINE 5 MG/ML INJ
10.0000 mg | INTRAVENOUS | Status: DC | PRN
  Filled 2018-05-20: qty 2

## 2018-05-20 MED ORDER — MISOPROSTOL 50MCG HALF TABLET
50.0000 ug | ORAL_TABLET | ORAL | Status: AC
  Administered 2018-05-20 – 2018-05-21 (×2): 50 ug via ORAL
  Filled 2018-05-20 (×2): qty 1

## 2018-05-20 MED ORDER — FENTANYL 2.5 MCG/ML BUPIVACAINE 1/10 % EPIDURAL INFUSION (WH - ANES)
14.0000 mL/h | INTRAMUSCULAR | Status: DC | PRN
  Administered 2018-05-20 – 2018-05-21 (×2): 14 mL/h via EPIDURAL
  Filled 2018-05-20 (×2): qty 100

## 2018-05-20 MED ORDER — LIDOCAINE HCL (PF) 1 % IJ SOLN
INTRAMUSCULAR | Status: DC | PRN
  Administered 2018-05-20 (×2): 5 mL via EPIDURAL

## 2018-05-20 NOTE — Progress Notes (Signed)
LABOR PROGRESS NOTE  Estrella MyrtleSaperria C Digirolamo is a 22 y.o. G2P0010 at 2669w4d  admitted for IOL for IUGR.   Objective: BP 101/69   Pulse 83   Temp 98.8 F (37.1 C) (Oral)   Resp 18   Ht 5\' 4"  (1.626 m)   Wt 87.5 kg   LMP 08/16/2017 (Exact Date)   BMI 33.13 kg/m  or  Vitals:   05/19/18 2301 05/19/18 2331 05/20/18 0001 05/20/18 0030  BP: 113/67 (!) 95/56 106/78 101/69  Pulse: 99 87 86 83  Resp: 18 19 16 18   Temp:      TempSrc:      Weight:      Height:        Dilation: 1.5 Effacement (%): 50 Cervical Position: Middle Station: -3 Presentation: Vertex Exam by:: Casey Burkitt. Hernandez, RN FHT: baseline rate 130, moderate varibility, +acel, no decel Toco: q3-4 min   Labs: Lab Results  Component Value Date   WBC 9.6 05/19/2018   HGB 12.2 05/19/2018   HCT 36.9 05/19/2018   MCV 87.6 05/19/2018   PLT 180 05/19/2018    Patient Active Problem List   Diagnosis Date Noted  . Supervision of high risk pregnancy, antepartum, third trimester 05/19/2018  . Chlamydia infection during pregnancy 05/19/2018  . IUGR (intrauterine growth restriction) affecting care of mother 05/19/2018  . Poor fetal growth affecting management of mother in third trimester   . [redacted] weeks gestation of pregnancy   . Encounter for fetal anatomic survey     Assessment / Plan: 22 y.o. G2P0010 at 7569w4d here for IOL for IUGR. GBS+, PCN.   Labor: Induction. FB in place. Low dose Pitocin.  Fetal Wellbeing:  Cat I  Pain Control:  Undecided  Anticipated MOD:  NSVD   Marcy Sirenatherine Wallace, D.O. OB Fellow  05/20/2018, 12:44 AM

## 2018-05-20 NOTE — Progress Notes (Addendum)
OB/GYN Faculty Practice: Labor Progress Note  Subjective: Patient resting in bed no complaints at this time. Had a HA earlier and received tylenol. HA has resolved. Is starting to feel stronger contractions. No other complaints at this time.  Objective: BP (!) 103/57   Pulse 93   Temp 98.6 F (37 C)   Resp 17   Ht 5\' 4"  (1.626 m)   Wt 87.5 kg   LMP 08/16/2017 (Exact Date)   BMI 33.13 kg/m  Gen: alert, NAD, well appearing  Dilation: 5 Effacement (%): 60, 70 Cervical Position: Middle Station: -2, -1 Presentation: Vertex Exam by:: Devra DoppAmber Knox, RN and Christianne DolinVictor Crescenzo  FHT: baseline 120s; moderate variability; accelerations; no decelerations   Assessment and Plan: 22 y.o. G2P0010 255w4d admitted for IOL for IUGR   Labor: Progressing  -- FB fell out around 0600  -- Low dose pit running  -- Tylenol for HA  -- pain control: Epidural when needed   Fetal Well-Being: [id number] <10%, AC 6% with normal dopplers. Cephalic byBSUS. .  -- Category 1  - continuous fetal monitoring  -- GBS (+)- PCN    John Derrel NipVictor Cresenzo MS4 [employer]  4:32 PM  This visit, including physical exam, was done in my presence and I agree with  the above student's note.  LEFTWICH-KIRBY, Eydie Wormley Certified Nurse-Midwife

## 2018-05-20 NOTE — Progress Notes (Signed)
Mom laying in bed trying to sleep.  She is uncomfortable, says as labor has progressed the pain medication has stopped being as effective.  She had not had fentanyl 50 dose for 2.5 hours.  Increase to fentanyl 100 Q1prn.  Patient would like an epidural when she had progressed enough.  -Dr. Parke SimmersBland

## 2018-05-20 NOTE — Anesthesia Procedure Notes (Signed)
Epidural Patient location during procedure: OB  Staffing Anesthesiologist: Brayant Dorr, MD Performed: anesthesiologist   Preanesthetic Checklist Completed: patient identified, site marked, surgical consent, pre-op evaluation, timeout performed, IV checked, risks and benefits discussed and monitors and equipment checked  Epidural Patient position: sitting Prep: DuraPrep Patient monitoring: heart rate, continuous pulse ox and blood pressure Approach: right paramedian Location: L3-L4 Injection technique: LOR saline  Needle:  Needle type: Tuohy  Needle gauge: 17 G Needle length: 9 cm and 9 Needle insertion depth: 6 cm Catheter type: closed end flexible Catheter size: 20 Guage Catheter at skin depth: 11 cm Test dose: negative  Assessment Events: blood not aspirated, injection not painful, no injection resistance, negative IV test and no paresthesia  Additional Notes Patient identified. Risks/Benefits/Options discussed with patient including but not limited to bleeding, infection, nerve damage, paralysis, failed block, incomplete pain control, headache, blood pressure changes, nausea, vomiting, reactions to medication both or allergic, itching and postpartum back pain. Confirmed with bedside nurse the patient's most recent platelet count. Confirmed with patient that they are not currently taking any anticoagulation, have any bleeding history or any family history of bleeding disorders. Patient expressed understanding and wished to proceed. All questions were answered. Sterile technique was used throughout the entire procedure. Please see nursing notes for vital signs. Test dose was given through epidural needle and negative prior to continuing to dose epidural or start infusion. Warning signs of high block given to the patient including shortness of breath, tingling/numbness in hands, complete motor block, or any concerning symptoms with instructions to call for help. Patient was given  instructions on fall risk and not to get out of bed. All questions and concerns addressed with instructions to call with any issues.     

## 2018-05-20 NOTE — Anesthesia Pain Management Evaluation Note (Signed)
  CRNA Pain Management Visit Note  Patient: Jamie Moore, 22 y.o., female  "Hello I am a [facility] of the anesthesia team at [facility]. We have an anesthesia team available at all times to provide care throughout the hospital, including epidural management and anesthesia for C-section. I don't know your plan for the delivery whether it a natural birth, water birth, IV sedation, nitrous supplementation, doula or epidural, but we want to meet your pain goals."   1.Was your pain managed to your expectations on prior hospitalizations?   No prior hospitalizations  2.What is your expectation for pain management during this hospitalization?     Epidural  3.How can we help you reach that goal? Epidural placement @ pain goal.  Record the patient's initial score and the patient's pain goal.   Pain: 6  Pain Goal: 8 [facility] wants you to be able to say your pain was always managed very well.  Jamie Moore 05/20/2018

## 2018-05-20 NOTE — Anesthesia Preprocedure Evaluation (Signed)

## 2018-05-20 NOTE — Progress Notes (Signed)
Jamie Moore is a 22 y.o. G2P0010 at 2248w4d admitted for induction of labor due to IUGR < 10%tile.  Subjective: Pt feeling cramping/contractions, feeling better after IV Fentanyl.  Objective: BP 110/79   Pulse 79   Temp 97.7 F (36.5 C) (Axillary)   Resp 15   Ht 5\' 4"  (1.626 m)   Wt 87.5 kg   LMP 08/16/2017 (Exact Date)   BMI 33.13 kg/m  I/O last 3 completed shifts: In: -  Out: 300 [Emesis/NG output:300] No intake/output data recorded.  FHT:  FHR: 130 bpm, variability: moderate,  accelerations:  Present,  decelerations:  Absent UC:   irregular, every 1-6 minutes SVE:   Dilation: 5 Effacement (%): 60, 70 Station: -2, -1 Exam by:: Devra DoppAmber Knox, RN and Time WarnerVictor Crescenzo  Labs: Lab Results  Component Value Date   WBC 9.6 05/19/2018   HGB 12.2 05/19/2018   HCT 36.9 05/19/2018   MCV 87.6 05/19/2018   PLT 180 05/19/2018    Assessment / Plan: Induction of labor due to IUGR,  progressing well on pitocin  Labor: Progressing normally Preeclampsia:  n/a Fetal Wellbeing:  Category I Pain Control:  IV pain meds I/D:  GBS positive on PCN Anticipated MOD:  NSVD  Sharen CounterLisa Leftwich-Kirby 05/20/2018, 5:56 PM

## 2018-05-20 NOTE — Progress Notes (Signed)
Vitals:   05/20/18 2036 05/20/18 2041  BP: 101/68 111/65  Pulse: 100 98  Resp:  17  Temp:    SpO2:     cx 4/50/-2  FHR Cat 1.    Ctx q 2-4 minutes, palpate moderate.  Pitocin at 7716mu/min, on for 2nd time for 11 hours. Discussed w/Dr. A.  Will try cytotec.

## 2018-05-20 NOTE — Progress Notes (Addendum)
OB/GYN Faculty Practice: Labor Progress Note Jamie Moore is 22 yo female admitted for induction of labor for IUGR  Subjective: Patient resting comfortably in bed. Reports she can feel contractions intermittently. Is not having pain at this time. Reports that she noticed a minor amount of blood on her toilet paper when she wiped after using the restroom this morning. Also reports one episode of vomiting this morning around 6 am.   Objective: BP 97/61   Pulse 92   Temp 97.7 F (36.5 C) (Oral)   Resp 15   Ht 5\' 4"  (1.626 m)   Wt 87.5 kg   LMP 08/16/2017 (Exact Date)   BMI 33.13 kg/m  Gen: Alerts, NAD, well appearing  Dilation: 4 Effacement (%): 50 Cervical Position: Posterior Station: -3 Presentation: Vertex Exam by:: Casey Burkitt. Hernandez, RN  FHT: baseline 130s- moderate variability,  + accelerations, no decelerations   Assessment and Plan: 22 y.o. G2P0010 3036w4d admitted for IOL for IUGR   Labor: Progressing  -- FB fell out around 0600  -- Low dose pit running  -- pain control: Epidural when needed   Fetal Well-Being: EFW 2393g <10%, AC 6% with normal dopplers. Cephalic by BSUS. .  -- Category 1  - continuous fetal monitoring  -- GBS (+)- PCN    John Derrel NipVictor Cresenzo MS4 [employer]  10:40 AM  This visit, including physical exam, was done in my presence and I agree with  the above student's note.  LEFTWICH-KIRBY, Takeria Marquina Certified Nurse-Midwife

## 2018-05-21 ENCOUNTER — Encounter (HOSPITAL_COMMUNITY): Payer: Self-pay

## 2018-05-21 DIAGNOSIS — O365930 Maternal care for other known or suspected poor fetal growth, third trimester, not applicable or unspecified: Secondary | ICD-10-CM

## 2018-05-21 DIAGNOSIS — O99824 Streptococcus B carrier state complicating childbirth: Secondary | ICD-10-CM

## 2018-05-21 DIAGNOSIS — Z3A39 39 weeks gestation of pregnancy: Secondary | ICD-10-CM

## 2018-05-21 LAB — GC/CHLAMYDIA PROBE AMP (~~LOC~~) NOT AT ARMC
Chlamydia: NEGATIVE
NEISSERIA GONORRHEA: NEGATIVE

## 2018-05-21 MED ORDER — ZOLPIDEM TARTRATE 5 MG PO TABS: 5.0000 mg | ORAL_TABLET | Freq: Every evening | ORAL | Status: DC | PRN

## 2018-05-21 MED ORDER — MISOPROSTOL 50MCG HALF TABLET
50.0000 ug | ORAL_TABLET | ORAL | Status: AC
  Administered 2018-05-21: 50 ug via ORAL
  Filled 2018-05-21: qty 1

## 2018-05-21 MED ORDER — ONDANSETRON HCL 4 MG/2ML IJ SOLN: 4.0000 mg | INTRAMUSCULAR | Status: DC | PRN

## 2018-05-21 MED ORDER — BENZOCAINE-MENTHOL 20-0.5 % EX AERO
1.0000 "application " | INHALATION_SPRAY | CUTANEOUS | Status: DC | PRN
  Administered 2018-05-21: 1 via TOPICAL
  Filled 2018-05-21: qty 56

## 2018-05-21 MED ORDER — SENNOSIDES-DOCUSATE SODIUM 8.6-50 MG PO TABS
2.0000 | ORAL_TABLET | ORAL | Status: DC
  Administered 2018-05-22 – 2018-05-23 (×2): 2 via ORAL
  Filled 2018-05-21 (×2): qty 2

## 2018-05-21 MED ORDER — COCONUT OIL OIL
1.0000 "application " | TOPICAL_OIL | Status: DC | PRN
  Administered 2018-05-22: 1 via TOPICAL
  Filled 2018-05-21: qty 120

## 2018-05-21 MED ORDER — WITCH HAZEL-GLYCERIN EX PADS
1.0000 "application " | MEDICATED_PAD | CUTANEOUS | Status: DC | PRN
  Administered 2018-05-21: 1 via TOPICAL

## 2018-05-21 MED ORDER — SIMETHICONE 80 MG PO CHEW: 80.0000 mg | CHEWABLE_TABLET | ORAL | Status: DC | PRN

## 2018-05-21 MED ORDER — IBUPROFEN 600 MG PO TABS
600.0000 mg | ORAL_TABLET | Freq: Four times a day (QID) | ORAL | Status: DC
  Administered 2018-05-21 – 2018-05-23 (×8): 600 mg via ORAL
  Filled 2018-05-21 (×8): qty 1

## 2018-05-21 MED ORDER — PRENATAL MULTIVITAMIN CH
1.0000 | ORAL_TABLET | Freq: Every day | ORAL | Status: DC
  Administered 2018-05-22 – 2018-05-23 (×2): 1 via ORAL
  Filled 2018-05-21 (×2): qty 1

## 2018-05-21 MED ORDER — ONDANSETRON HCL 4 MG PO TABS: 4.0000 mg | ORAL_TABLET | ORAL | Status: DC | PRN

## 2018-05-21 MED ORDER — ACETAMINOPHEN 325 MG PO TABS: 650.0000 mg | ORAL_TABLET | ORAL | Status: DC | PRN

## 2018-05-21 MED ORDER — DIBUCAINE 1 % RE OINT
1.0000 "application " | TOPICAL_OINTMENT | RECTAL | Status: DC | PRN
  Administered 2018-05-21: 1 via RECTAL
  Filled 2018-05-21: qty 28

## 2018-05-21 MED ORDER — DIPHENHYDRAMINE HCL 25 MG PO CAPS: 25.0000 mg | ORAL_CAPSULE | Freq: Four times a day (QID) | ORAL | Status: DC | PRN

## 2018-05-21 NOTE — Progress Notes (Deleted)
OB/GYN Faculty Practice Delivery Note  Estrella MyrtleSaperria C Hartsell is a 22 y.o. G2P0010 s/p SVD at 4642w5d. She was admitted for IOL due to IUGR <10 percentile.   ROM: 0h 7381m with moderate meconium fluid GBS Status: Positive  Maximum Maternal Temperature: 37.0 C   Labor Progress: . Patient entered active labor 05/21/18. Dilation completes at 0959.   Delivery Date/Time: 05/21/2018 1036  Delivery: Called to room and patient was complete and pushing. Head delivered ROA. No nuchal cord present. 1 natural knot in cord. Shoulder and body delivered in usual fashion. Infant with spontaneous cry, placed on mother's abdomen, dried and stimulated. Cord clamped x 2 after 1-minute delay, and cut by FOB. Cord blood drawn. Placenta delivered spontaneously with no traction. Fundus firm with massage and Pitocin. Labia, perineum, vagina, and cervix inspected inspected.   Placenta: intact  Complications: post partum hemorrhage (600 mL EBL_  Lacerations:  Two lacerations, first degree sulcal and right periurethral. Periurethral repaired with 4.0 rapid and sulcal repaired with 4.0 vicryl. EBL: 600 ml   Postpartum Planning [ ]  message to sent to schedule follow-up  [ ]  vaccines UTD  Infant: Female   APGARs 9 at 1 and 5 minutes  Darryl Willner Derrel NipVictor Lorann Tani, MS4  [employer]

## 2018-05-21 NOTE — Lactation Note (Signed)
This note was copied from a baby's chart. Lactation Consultation Note  Patient Name: Jamie Moore Jamie Moore Date: 05/21/2018 Reason for consult: Initial assessment;Primapara;1st time breastfeeding;Term;Infant < 6lbs  P1 mother whose infant is now 826 hours old.  This is a term baby weighing <6 lbs  Baby sleeping in bassinet when I arrived.  Mother had attempted to breastfeed approximately 30 minutes ago and baby was not interested.  Encouraged mother to feed 8-12 times/24 hours or sooner if baby shows feeding cues.  Reviewed cues with mother.  Mother is familiar with hand expression and I encouraged this before/after feedings.  Colostrum container provided for any EBM she may obtain with hand expression.  Demonstrated how to finger feed.    [facility] Tuesday morning.  She does not have a DEBP at this time but is planning on getting one to pump and store breast milk before returning to work.    Mom made aware of O/P services, breastfeeding support groups, community resources, and our phone # for post-discharge questions. Mother encouraged to call RN/LC at next feeding for latch assistance.  Mother verbalized understanding.  Father present and sleeping.   Maternal Data Formula Feeding for Exclusion: No Has patient been taught Hand Expression?: Yes Does the patient have breastfeeding experience prior to this delivery?: No  Feeding Feeding Type: Breast Fed Length of feed: 0 min  LATCH Score                   Interventions    Lactation Tools Discussed/Used WIC Program: Yes   Consult Status Consult Status: Follow-up Date: 05/22/18 Follow-up type: In-patient    Dora SimsBeth R Ailene Royal 05/21/2018, 4:42 PM

## 2018-05-21 NOTE — Anesthesia Postprocedure Evaluation (Signed)
Anesthesia Post Note  Patient: Estrella MyrtleSaperria C Facer  Procedure(s) Performed: AN AD HOC LABOR EPIDURAL     Patient location during evaluation: Mother Baby Anesthesia Type: Epidural Level of consciousness: awake and alert and oriented Pain management: satisfactory to patient Vital Signs Assessment: post-procedure vital signs reviewed and stable Respiratory status: spontaneous breathing and nonlabored ventilation Cardiovascular status: stable Postop Assessment: no headache, no backache, no signs of nausea or vomiting, adequate PO intake, patient able to bend at knees and epidural receding (patient up walking) Anesthetic complications: no    Last Vitals:  Vitals:   05/21/18 1311 05/21/18 1759  BP: 106/78 104/80  Pulse: 95 80  Resp: 18 18  Temp: 37.1 C 36.5 C  SpO2: 100% 100%    Last Pain:  Vitals:   05/21/18 1759  TempSrc: Oral  PainSc:    Pain Goal: Patients Stated Pain Goal: 2 (05/21/18 1755)               Madison HickmanGREGORY,Nielle Duford

## 2018-05-21 NOTE — Progress Notes (Signed)
Strip note. Discussed plan of care with nurse. Patient is now complete, SROM about 20 minutes ago for light meconium. Has epidural, not feeling contractions. Category I strip at this time though periods of minimal variability. Currently on side. Discussed laboring down for about 30 minutes and then starting to push. Maternal tachycardia but has had periods of this throughout labor. Low suspicion for intrauterine infection at this time.

## 2018-05-22 NOTE — Progress Notes (Signed)
POSTPARTUM PROGRESS NOTE  Post Partum Day 1  Subjective:  Jamie Moore is a 22 y.o. G2P1011 s/p SVD at 5870w5d.  No acute events overnight.  Pt denies problems with ambulating, voiding or po intake.  She denies nausea or vomiting.  Pain is well controlled.  She has had flatus. She has not had bowel movement.  Lochia Small.   Objective: Blood pressure 103/76, pulse 68, temperature 98.2 F (36.8 C), temperature source Oral, resp. rate 18, height 5\' 4"  (1.626 m), weight 87.5 kg, last menstrual period 08/16/2017, SpO2 100 %, unknown if currently breastfeeding.  Physical Exam:  General: alert, cooperative and no distress Abdomen: soft, nontender,  Uterine Fundus: firm, appropriately tender DVT Evaluation: No calf swelling or tenderness Extremities: no edema Skin: warm, dry  Recent Labs    05/19/18 1540  HGB 12.2  HCT 36.9    Assessment/Plan: Jamie Moore is a 22 y.o. G2P1011 s/p SVD at 2770w5d   PPD#1 - Doing well Contraception: unsure Feeding: breast Dispo: Plan for discharge today if baby is discharged home.   LOS: 3 days   Sharyon CableRogers, Avabella Wailes C, CNM 05/22/2018, 8:50 AM

## 2018-05-22 NOTE — Lactation Note (Signed)
This note was copied from a baby's chart. Lactation Consultation Note  Patient Name: Jamie Moore UHKIS'N Date: 05/22/2018 Reason for consult: Follow-up assessment;Infant < 6lbs;Infant weight loss;1st time breastfeeding;Primapara  27 hours old < 6 lbs FT female who is being exclusively BF by her mother, she's a P1. Baby is at 6% weight loss, per mom BF is going well she voiced baby was finally able to latch this morning and she heard some swallows. Explained to mom that for babies under 6 lbs is better to start supplementing with mom's own milk ASAP especially if we see a weight loss approaching the 7-10% mark. Asked her how does she feel about pumping and she said she'd like to try it, that was actually part of baby's feeding plan for today.  Set up a DEBP, had to go back to the stock room to get a different kit because the first one was defective (suction only on one breast). Pump instructions, cleaning and storage were reviewed, as well as supplementation guidelines. Both parents are aware that if mom not able to get required volumes of breast milk according to supplementation chart, formula supplementation may be necessary. Both parents agreed and voice understanding. Supplementation guidelines were discusses.  Baby asleep when entering the room, mom will call for latch assistance when needed, mom very cooperative, she started pumping right away. Encouraged mom to keep feeding baby STS 8-12 times/24 hours or sooner if feeding cues are present. Discussed cluster feeding. Mom will be pumping every 3 hours and spoon and finger feed baby any amount of EBM. Parents aware of [facility] services and will call PRN.  Maternal Data    Feeding Feeding Type: Breast Fed Length of feed: 12 min  Interventions Interventions: Breast feeding basics reviewed;DEBP  Lactation Tools Discussed/Used Tools: Pump Breast pump type: Double-Electric Breast Pump Pump Review: Setup, frequency, and cleaning Initiated  by:: MPeck Date initiated:: 05/22/18   Consult Status Consult Status: Follow-up Date: 05/23/18 Follow-up type: In-patient    Jamie Moore 05/22/2018, 1:54 PM

## 2018-05-23 MED ORDER — IBUPROFEN 600 MG PO TABS: 600.0000 mg | ORAL_TABLET | Freq: Four times a day (QID) | ORAL | 0 refills | Status: DC | PRN

## 2018-05-23 NOTE — Discharge Instructions (Signed)
Vaginal Delivery, Care After °Refer to this sheet in the next few weeks. These instructions provide you with information about caring for yourself after vaginal delivery. Your health care provider may also give you more specific instructions. Your treatment has been planned according to current medical practices, but problems sometimes occur. Call your health care provider if you have any problems or questions. °What can I expect after the procedure? °After vaginal delivery, it is common to have: °· Some bleeding from your vagina. °· Soreness in your abdomen, your vagina, and the area of skin between your vaginal opening and your anus (perineum). °· Pelvic cramps. °· Fatigue. ° °Follow these instructions at home: °Medicines °· Take over-the-counter and prescription medicines only as told by your health care provider. °· If you were prescribed an antibiotic medicine, take it as told by your health care provider. Do not stop taking the antibiotic until it is finished. °Driving ° °· Do not drive or operate heavy machinery while taking prescription pain medicine. °· Do not drive for 24 hours if you received a sedative. °Lifestyle °· Do not drink alcohol. This is especially important if you are breastfeeding or taking medicine to relieve pain. °· Do not use tobacco products, including cigarettes, chewing tobacco, or e-cigarettes. If you need help quitting, ask your health care provider. °Eating and drinking °· Drink at least 8 eight-ounce glasses of water every day unless you are told not to by your health care provider. If you choose to breastfeed your baby, you may need to drink more water than this. °· Eat high-fiber foods every day. These foods may help prevent or relieve constipation. High-fiber foods include: °? Whole grain cereals and breads. °? Brown rice. °? Beans. °? Fresh fruits and vegetables. °Activity °· Return to your normal activities as told by your health care provider. Ask your health care provider  what activities are safe for you. °· Rest as much as possible. Try to rest or take a nap when your baby is sleeping. °· Do not lift anything that is heavier than your baby or 10 lb (4.5 kg) until your health care provider says that it is safe. °· Talk with your health care provider about when you can engage in sexual activity. This may depend on your: °? Risk of infection. °? Rate of healing. °? Comfort and desire to engage in sexual activity. °Vaginal Care °· If you have an episiotomy or a vaginal tear, check the area every day for signs of infection. Check for: °? More redness, swelling, or pain. °? More fluid or blood. °? Warmth. °? Pus or a bad smell. °· Do not use tampons or douches until your health care provider says this is safe. °· Watch for any blood clots that may pass from your vagina. These may look like clumps of dark red, brown, or black discharge. °General instructions °· Keep your perineum clean and dry as told by your health care provider. °· Wear loose, comfortable clothing. °· Wipe from front to back when you use the toilet. °· Ask your health care provider if you can shower or take a bath. If you had an episiotomy or a perineal tear during labor and delivery, your health care provider may tell you not to take baths for a certain length of time. °· Wear a bra that supports your breasts and fits you well. °· If possible, have someone help you with household activities and help care for your baby for at least a few days after   you leave the hospital. °· Keep all follow-up visits for you and your baby as told by your health care provider. This is important. °Contact a health care provider if: °· You have: °? Vaginal discharge that has a bad smell. °? Difficulty urinating. °? Pain when urinating. °? A sudden increase or decrease in the frequency of your bowel movements. °? More redness, swelling, or pain around your episiotomy or vaginal tear. °? More fluid or blood coming from your episiotomy or  vaginal tear. °? Pus or a bad smell coming from your episiotomy or vaginal tear. °? A fever. °? A rash. °? Little or no interest in activities you used to enjoy. °? Questions about caring for yourself or your baby. °· Your episiotomy or vaginal tear feels warm to the touch. °· Your episiotomy or vaginal tear is separating or does not appear to be healing. °· Your breasts are painful, hard, or turn red. °· You feel unusually sad or worried. °· You feel nauseous or you vomit. °· You pass large blood clots from your vagina. If you pass a blood clot from your vagina, save it to show to your health care provider. Do not flush blood clots down the toilet without having your health care provider look at them. °· You urinate more than usual. °· You are dizzy or light-headed. °· You have not breastfed at all and you have not had a menstrual period for 12 weeks after delivery. °· You have stopped breastfeeding and you have not had a menstrual period for 12 weeks after you stopped breastfeeding. °Get help right away if: °· You have: °? Pain that does not go away or does not get better with medicine. °? Chest pain. °? Difficulty breathing. °? Blurred vision or spots in your vision. °? Thoughts about hurting yourself or your baby. °· You develop pain in your abdomen or in one of your legs. °· You develop a severe headache. °· You faint. °· You bleed from your vagina so much that you fill two sanitary pads in one hour. °This information is not intended to replace advice given to you by your health care provider. Make sure you discuss any questions you have with your health care provider. °Document Released: 09/05/2000 Document Revised: 02/20/2016 Document Reviewed: 09/23/2015 °Elsevier Interactive Patient Education © 2018 [employer] ° °

## 2018-05-23 NOTE — Discharge Summary (Signed)
OB Discharge Summary     Patient Name: Jamie Moore DOB: 01/26/1996 MRN: [id number]  Date of admission: 05/19/2018 Delivering MD: Tamera Stands   Date of discharge: 05/23/2018  Admitting diagnosis: INDUCTION Intrauterine pregnancy: [redacted]w[redacted]d     Secondary diagnosis:  Active Problems:   Poor fetal growth affecting management of mother in third trimester   Supervision of high risk pregnancy, antepartum, third trimester   Chlamydia infection during pregnancy   IUGR (intrauterine growth restriction) affecting care of mother   Vaginal delivery  Additional problems: self-transfer of care from Dr Shawnie Pons to [facility]     Discharge diagnosis: Term Pregnancy Delivered                                                                                                Post partum procedures:none  Augmentation: Pitocin, Cytotec and Foley Balloon  Complications: None  Hospital course:  Induction of Labor With Vaginal Delivery   22 y.o. yo G2P1011 at [redacted]w[redacted]d was admitted to the hospital 05/19/2018 for induction of labor. She had been a pt of Dr Tawni Levy with MFM referral due to IUGR. Her first office visit was 8/28 where review of records and pt statements indicated she was to deliver @ 39wks. She was sent to L&D. Indication for induction: IUGR <10%.  She underwent the usually induction methods and by approx 36 hrs after induction start, progressed to SVD. Patient had an uncomplicated labor course as follows: Membrane Rupture Time/Date: 9:56 AM ,05/21/2018   Intrapartum Procedures: Episiotomy: None [1]                                         Lacerations:  1st degree [2];Perineal [11]  Patient had delivery of a Viable infant.  Information for the patient's newborn:  Joselynne, Killam [[id number]]  Delivery Method: Vag-Spont   05/21/2018  Details of delivery can be found in separate delivery note. GC/chlam culture collected during hospitalization was negative (+chlam tx on 8/15).  Patient had a routine  postpartum course. Patient is discharged home 05/23/18.  Physical exam  Vitals:   05/22/18 0556 05/22/18 1451 05/22/18 2350 05/23/18 0615  BP: 103/76 94/79 106/75 91/65  Pulse: 68 73 65 67  Resp: 18 18 18 18   Temp: 98.2 F (36.8 C) 98.2 F (36.8 C) 98.3 F (36.8 C) 98 F (36.7 C)  TempSrc: Oral Oral Oral Oral  SpO2:      Weight:      Height:       General: alert and cooperative Lochia: appropriate Uterine Fundus: firm Incision: N/A DVT Evaluation: No evidence of DVT seen on physical exam. Labs: Lab Results  Component Value Date   WBC 9.6 05/19/2018   HGB 12.2 05/19/2018   HCT 36.9 05/19/2018   MCV 87.6 05/19/2018   PLT 180 05/19/2018   No flowsheet data found.  Discharge instruction: per After Visit Summary and "Baby and Me Booklet".  After visit meds:  Allergies as of 05/23/2018   No Known Allergies  Medication List    STOP taking these medications   metroNIDAZOLE 500 MG tablet Commonly known as:  FLAGYL   terconazole 0.4 % vaginal cream Commonly known as:  TERAZOL 7     TAKE these medications   ibuprofen 600 MG tablet Commonly known as:  ADVIL,MOTRIN Take 1 tablet (600 mg total) by mouth every 6 (six) hours as needed.   PRENATAL VITAMIN PO Take by mouth.       Diet: routine diet  Activity: Advance as tolerated. Pelvic rest for 6 weeks.   Outpatient follow up:4 weeks Follow up Appt: Future Appointments  Date Time Provider Department Center  06/23/2018  4:20 PM Arvilla Market, DO [facility]   Follow up Visit:No follow-ups on file.  Postpartum contraception: LARC  Newborn Data: Live born female  Birth Weight: 5 lb 13.5 oz (2650 g) APGAR: 9, 9  Newborn Delivery   Birth date/time:  05/21/2018 10:36:00 Delivery type:  Vaginal, Spontaneous     Baby Feeding: Breast Disposition:home with mother pending peds   05/23/2018 Cam Hai, CNM
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
## 2018-05-24 ENCOUNTER — Ambulatory Visit: Payer: Self-pay

## 2018-05-24 NOTE — Lactation Note (Signed)
This note was copied from a baby's chart. Lactation Consultation Note  Patient Name: Girl Elizia Paolo GURKY'H Date: 05/24/2018   Baby 69 hours old.  Yesterday mother started supplementing w/ formula in addition to breastfeeding. Weight has increased.  Voids/Stools WNL. Faxed pump referral to [facility] in [facility]. Mom has my # to call for assist w/next feeding.      Maternal Data    Feeding Feeding Type: Bottle Fed - Formula Length of feed: 15 min  LATCH Score                   Interventions    Lactation Tools Discussed/Used     Consult Status      Hardie Pulley 05/24/2018, 8:22 AM

## 2018-06-11 ENCOUNTER — Encounter: Payer: Self-pay | Admitting: *Deleted

## 2018-06-23 ENCOUNTER — Ambulatory Visit: Payer: Self-pay | Admitting: Internal Medicine

## 2018-06-28 ENCOUNTER — Ambulatory Visit (INDEPENDENT_AMBULATORY_CARE_PROVIDER_SITE_OTHER): Payer: Managed Care, Other (non HMO) | Admitting: Internal Medicine

## 2018-06-28 DIAGNOSIS — Z1389 Encounter for screening for other disorder: Secondary | ICD-10-CM | POA: Diagnosis not present

## 2018-06-28 LAB — POCT PREGNANCY, URINE: PREG TEST UR: NEGATIVE

## 2018-06-28 NOTE — Progress Notes (Signed)
Subjective:     Jamie Moore is a 22 y.o. female who presents for a postpartum visit. She is 5wks and 3 dayspostpartum following a spontaneous vaginal delivery. I have fully reviewed the prenatal and intrapartum course. The delivery was at [redacted]w[redacted]d gestational weeks. Outcome: spontaneous vaginal delivery. Anesthesia: epidural. Postpartum course has been without complications. Baby's course has been uncomplicated Baby is feeding by bottle - Gerber Gentle. Bleeding no bleeding. Bowel function is normal. Bladder function is normal. Patient is sexually active (last sexually active on 9/25 or 9/26--unsure of exact date). Contraception method is none. Postpartum depression screening: negative.  The following portions of the patient's history were reviewed and updated as appropriate: allergies, current medications, past family history, past medical history, past social history, past surgical history and problem list.  Review of Systems Pertinent items are noted in HPI.   Objective:    BP 119/76   Pulse (!) 105   Wt 175 lb 6.4 oz (79.6 kg)   LMP 08/16/2017 (Exact Date)   BMI 30.11 kg/m   General:  alert and cooperative  Lungs: clear to auscultation bilaterally  Heart:  regular rate and rhythm, S1, S2 normal, no murmur, click, rub or gallop  Abdomen: soft, non-tender; bowel sounds normal; no masses,  no organomegaly        Assessment:    Benign postpartum exam. Pap smear not done at today's visit due to age <70 years old.   Plan:    1. Contraception: IUD. Given that has been less than two weeks since sexually active, per protocol patient will need to return for Upreg after abstaining for two weeks. If Upreg negative, can then have IUD inserted. Patient voiced understanding.  2. Follow up in: 1 year for annual exam or as needed.

## 2018-06-28 NOTE — Patient Instructions (Signed)
Abstain from sexual intercourse for a full two weeks. Then please return for a repeat Urine pregnancy. If negative, you can receive your IUD at that visit. Go ahead and schedule the visit for the end of this week or early next week if you'd like.

## 2018-07-06 ENCOUNTER — Ambulatory Visit: Payer: Self-pay | Admitting: Advanced Practice Midwife

## 2018-07-21 ENCOUNTER — Emergency Department (HOSPITAL_BASED_OUTPATIENT_CLINIC_OR_DEPARTMENT_OTHER)
Admission: EM | Admit: 2018-07-21 | Discharge: 2018-07-21 | Disposition: A | Discharge status: Home / Self Care | Payer: Managed Care, Other (non HMO) | Attending: Emergency Medicine | Admitting: Emergency Medicine

## 2018-07-21 ENCOUNTER — Emergency Department (HOSPITAL_BASED_OUTPATIENT_CLINIC_OR_DEPARTMENT_OTHER): Payer: Managed Care, Other (non HMO)

## 2018-07-21 ENCOUNTER — Encounter (HOSPITAL_BASED_OUTPATIENT_CLINIC_OR_DEPARTMENT_OTHER): Payer: Self-pay

## 2018-07-21 ENCOUNTER — Other Ambulatory Visit: Payer: Self-pay

## 2018-07-21 DIAGNOSIS — Y999 Unspecified external cause status: Secondary | ICD-10-CM | POA: Insufficient documentation

## 2018-07-21 DIAGNOSIS — S0990XA Unspecified injury of head, initial encounter: Secondary | ICD-10-CM | POA: Diagnosis present

## 2018-07-21 DIAGNOSIS — Y9301 Activity, walking, marching and hiking: Secondary | ICD-10-CM | POA: Diagnosis not present

## 2018-07-21 DIAGNOSIS — Y9289 Other specified places as the place of occurrence of the external cause: Secondary | ICD-10-CM | POA: Diagnosis not present

## 2018-07-21 DIAGNOSIS — J339 Nasal polyp, unspecified: Secondary | ICD-10-CM | POA: Diagnosis not present

## 2018-07-21 DIAGNOSIS — S0083XA Contusion of other part of head, initial encounter: Secondary | ICD-10-CM

## 2018-07-21 MED ORDER — ACETAMINOPHEN 500 MG PO TABS
1000.0000 mg | ORAL_TABLET | Freq: Once | ORAL | Status: AC
  Administered 2018-07-21: 1000 mg via ORAL
  Filled 2018-07-21: qty 2

## 2018-07-21 MED ORDER — IBUPROFEN 400 MG PO TABS
600.0000 mg | ORAL_TABLET | Freq: Once | ORAL | Status: AC
  Administered 2018-07-21: 600 mg via ORAL
  Filled 2018-07-21: qty 1
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
## 2018-07-21 NOTE — ED Notes (Signed)
Pt/family verbalized understanding of discharge instructions.   

## 2018-08-08 ENCOUNTER — Emergency Department (HOSPITAL_COMMUNITY)
Admission: EM | Admit: 2018-08-08 | Discharge: 2018-08-09 | Disposition: A | Discharge status: Other Acute Inpatient Hospital | Payer: Managed Care, Other (non HMO) | Attending: Emergency Medicine | Admitting: Emergency Medicine

## 2018-08-08 ENCOUNTER — Encounter (HOSPITAL_COMMUNITY): Payer: Self-pay | Admitting: Emergency Medicine

## 2018-08-08 ENCOUNTER — Other Ambulatory Visit: Payer: Self-pay

## 2018-08-08 DIAGNOSIS — Z79899 Other long term (current) drug therapy: Secondary | ICD-10-CM | POA: Insufficient documentation

## 2018-08-08 DIAGNOSIS — F332 Major depressive disorder, recurrent severe without psychotic features: Secondary | ICD-10-CM | POA: Diagnosis present

## 2018-08-08 LAB — CBC WITH DIFFERENTIAL/PLATELET
ABS IMMATURE GRANULOCYTES: 0.03 10*3/uL (ref 0.00–0.07)
Basophils Absolute: 0.1 10*3/uL (ref 0.0–0.1)
Basophils Relative: 1 %
Eosinophils Absolute: 0 10*3/uL (ref 0.0–0.5)
Eosinophils Relative: 0 %
HCT: 41.8 % (ref 36.0–46.0)
Hemoglobin: 13.5 g/dL (ref 12.0–15.0)
Immature Granulocytes: 0 %
LYMPHS ABS: 2.3 10*3/uL (ref 0.7–4.0)
Lymphocytes Relative: 22 %
MCH: 28.3 pg (ref 26.0–34.0)
MCHC: 32.3 g/dL (ref 30.0–36.0)
MCV: 87.6 fL (ref 80.0–100.0)
MONO ABS: 0.5 10*3/uL (ref 0.1–1.0)
MONOS PCT: 5 %
NEUTROS ABS: 7.7 10*3/uL (ref 1.7–7.7)
Neutrophils Relative %: 72 %
Platelets: 299 10*3/uL (ref 150–400)
RBC: 4.77 MIL/uL (ref 3.87–5.11)
RDW: 14 % (ref 11.5–15.5)
WBC: 10.6 10*3/uL — ABNORMAL HIGH (ref 4.0–10.5)
nRBC: 0 % (ref 0.0–0.2)

## 2018-08-08 LAB — RAPID URINE DRUG SCREEN, HOSP PERFORMED
AMPHETAMINES: NOT DETECTED
BENZODIAZEPINES: NOT DETECTED
Barbiturates: NOT DETECTED
Cocaine: NOT DETECTED
OPIATES: NOT DETECTED
Tetrahydrocannabinol: NOT DETECTED

## 2018-08-08 LAB — COMPREHENSIVE METABOLIC PANEL
ALBUMIN: 4 g/dL (ref 3.5–5.0)
ALK PHOS: 77 U/L (ref 38–126)
ALT: 14 U/L (ref 0–44)
ANION GAP: 9 (ref 5–15)
AST: 22 U/L (ref 15–41)
BUN: 8 mg/dL (ref 6–20)
CALCIUM: 8.8 mg/dL — AB (ref 8.9–10.3)
CO2: 21 mmol/L — ABNORMAL LOW (ref 22–32)
Chloride: 101 mmol/L (ref 98–111)
Creatinine, Ser: 0.62 mg/dL (ref 0.44–1.00)
GFR calc Af Amer: >60 mL/min (ref >60)
GLUCOSE: 86 mg/dL (ref 70–99)
Potassium: 3.6 mmol/L (ref 3.5–5.1)
Sodium: 131 mmol/L — ABNORMAL LOW (ref 135–145)
Total Bilirubin: 0.8 mg/dL (ref 0.3–1.2)
Total Protein: 7.2 g/dL (ref 6.5–8.1)

## 2018-08-08 LAB — I-STAT BETA HCG BLOOD, ED (MC, WL, AP ONLY)

## 2018-08-08 LAB — ETHANOL

## 2018-08-08 MED ORDER — HYDROXYZINE HCL 50 MG PO TABS: 50.0000 mg | ORAL_TABLET | Freq: Three times a day (TID) | ORAL | Status: DC | PRN

## 2018-08-08 MED ORDER — TRAZODONE HCL 100 MG PO TABS: 100.0000 mg | ORAL_TABLET | Freq: Every evening | ORAL | Status: DC | PRN

## 2018-08-08 NOTE — BH Assessment (Addendum)
Tele Assessment Note   Patient Name: Jamie Moore MRN: [id number] Referring Physician: Dr. Dagoberto Ligas Location of Patient: [facility]        Bed: 025C Location of Provider: Behavioral Health TTS Department  Jamie Moore is an 22 y.o. female presenting with SI with plan to overdose on pills. Patient reports having plan to overdose since yesterday. Patient has no significant medical or past documented psychiatric history. Patient reported having SI "on and off for a couple of years". Patient reported symptoms are severe and gradually worsened following birth of her child 2 months ago. Patient reached out to family for help and was brought to ED by her brother's mom. Patient stated, "my emotions are really hight to extreme lows, I wake up to bad days, its to the point I am really drained". Patient reports increased depression, sleeping a lot during the day, poor appetite, increased crying spells, anger/irritability, fatigue, isolating, loss of interest in usual pleasures and worthless/self pity. Patient reported getting frustrated really easy, trying not to show it, but feeling that her baby can sense her frustrations. Patient reported symptoms have been exacerbated by recent job loss, conflict with her daughter's father, moving back in with her parents and balancing life as a new parent. Patient currently lives with grandparents, dad and new baby. Patient endorses past history of cutting, none presently and denies history of suicide attempts. Patient denied thoughts of harming her new baby. Patient denied HI, psychosis and drug usage.   Patient cooperative during assessment. Patient is alert and oriented x4. Judgement is unimpaired. Thought process is coherent. Patient mood is depressed, despair, sad and helpless. Affect is depressed and sad. Patient stated she is willing to sign herself in for inpatient treatment.  UDS in process ETOH in process  Diagnosis: Major Depressive Disorder  Past  Medical History:  Past Medical History:  Diagnosis Date  . Medical history non-contributory     Past Surgical History:  Procedure Laterality Date  . NO PAST SURGERIES      Family History:  Family History  Problem Relation Age of Onset  . Hypertension Mother   . Hypertension Father   . Diabetes Maternal Grandmother   . Diabetes Paternal Grandmother     Social History:  reports that she has never smoked. She has never used smokeless tobacco. She reports that she does not drink alcohol or use drugs.  Additional Social History:  Alcohol / Drug Use Pain Medications: see MAR Prescriptions: see MAR Over the Counter: see MAR  CIWA:   COWS:    Allergies: No Known Allergies  Home Medications:  (Not in a hospital admission)  OB/GYN Status:  Patient's last menstrual period was 08/16/2017 (exact date).  General Assessment Data Location of Assessment: [facility] ED TTS Assessment: In system Is this a Tele or Face-to-Face Assessment?: Tele Assessment Is this an Initial Assessment or a Re-assessment for this encounter?: Initial Assessment Patient Accompanied by:: N/A Language Other than English: No Living Arrangements: (family home) What gender do you identify as?: Female Pregnancy Status: No Living Arrangements: (dad, grandmom/dad and 64 month old baby) Can pt return to current living arrangement?: Yes Admission Status: Voluntary Is patient capable of signing voluntary admission?: Yes Referral Source: Self/Family/Friend     Crisis Care Plan Living Arrangements: (dad, grandmom/dad and 62 month old baby) Legal Guardian: (self) Name of Psychiatrist: (none) Name of Therapist: (none)  Education Status Is patient currently in school?: No Is the patient employed, unemployed or receiving disability?: Unemployed  Risk  to self with the past 6 months Suicidal Ideation: Yes-Currently Present Suicidal Intent: Yes-Currently Present Has patient had any suicidal intent within the past 6  months prior to admission? : Yes Is patient at risk for suicide?: Yes Suicidal Plan?: Yes-Currently Present Has patient had any suicidal plan within the past 6 months prior to admission? : Yes Specify Current Suicidal Plan: (to overdose on pills) Access to Means: Yes Specify Access to Suicidal Means: over the counter pills in the home What has been your use of drugs/alcohol within the last 12 months?: (.5 cup alcohol weekly) Previous Attempts/Gestures: No How many times?: (0) Other Self Harm Risks: (none) Triggers for Past Attempts: (n/a) Intentional Self Injurious Behavior: None Family Suicide History: No Recent stressful life event(s): Conflict (Comment), Financial Problems(arguements with baby daddy and family discord) Persecutory voices/beliefs?: No Depression: Yes Depression Symptoms: Tearfulness, Fatigue, Isolating, Loss of interest in usual pleasures, Feeling angry/irritable, Feeling worthless/self pity Substance abuse history and/or treatment for substance abuse?: No  Risk to Others within the past 6 months Homicidal Ideation: No Does patient have any lifetime risk of violence toward others beyond the six months prior to admission? : No Thoughts of Harm to Others: No Current Homicidal Intent: No Current Homicidal Plan: No Access to Homicidal Means: No Identified Victim: (n/a) History of harm to others?: No Assessment of Violence: None Noted Violent Behavior Description: (n/a) Does patient have access to weapons?: No Criminal Charges Pending?: No Does patient have a court date: No Is patient on probation?: No  Psychosis Hallucinations: None noted Delusions: None noted  Mental Status Report Appearance/Hygiene: Unremarkable Eye Contact: Fair Motor Activity: Unremarkable Speech: Logical/coherent Level of Consciousness: Alert Mood: Depressed, Despair, Sad, Helpless Affect: Depressed, Sad Anxiety Level: Minimal Thought Processes: Coherent Judgement:  Unimpaired Orientation: Person, Place, Time, Situation Obsessive Compulsive Thoughts/Behaviors: None  Cognitive Functioning Concentration: Normal Memory: Recent Intact, Remote Intact Is patient IDD: No Insight: Fair Impulse Control: Fair Appetite: Poor Have you had any weight changes? : No Change Sleep: Increased Total Hours of Sleep: (3 ) Vegetative Symptoms: Staying in bed, Decreased grooming  ADLScreening [facility] Assessment Services) Patient's cognitive ability adequate to safely complete daily activities?: Yes Patient able to express need for assistance with ADLs?: Yes Independently performs ADLs?: Yes (appropriate for developmental age)  Prior Inpatient Therapy Prior Inpatient Therapy: No  Prior Outpatient Therapy Prior Outpatient Therapy: No Does patient have an ACCT team?: No Does patient have Intensive In-House Services?  : No Does patient have [facility] services? : No Does patient have P4CC services?: No  ADL Screening (condition at time of admission) Patient's cognitive ability adequate to safely complete daily activities?: Yes Patient able to express need for assistance with ADLs?: Yes Independently performs ADLs?: Yes (appropriate for developmental age)    [employer]chant [employer] officer (For Healthcare) Does Patient Have a Medical Advance Directive?: No Would patient like information on creating a medical advance directive?: No - Patient declined   Disposition:  Disposition Initial Assessment Completed for this Encounter: Yes  Nira Conn, NP, patient meets inpatient criteria. Binnie Rail, [facility], no available beds at this time, will re-evaluate in the morning after discharges for bed availability. Arlys John, RN, informed of disposition.  This service was provided via telemedicine using a 2-way, interactive audio and video technology.  Names of all persons participating in this telemedicine service and their role in this encounter. Name: Austin Pongratz Role: patient  Name:  Al Corpus, [facility] Role: TTS Clinician  Name:  Role:   Name:  Role:  Burnetta SabinLatisha D Custer Pimenta 08/08/2018 9:47 PM

## 2018-08-08 NOTE — ED Provider Notes (Signed)
[facility] EMERGENCY DEPARTMENT Provider Note  CSN: [id number] Arrival date & time: 08/08/18  2047  History   Chief Complaint Chief Complaint  Patient presents with  . Postpartum Complications  . Suicidal    HPI Jamie Moore is a 22 y.o. female who has no significant medical or past documented psychiatric history who presented to the ED for suicidal ideation. Onset of symptoms was gradual starting following birth of her child 2 months ago. Symptoms are of severe severity and are gradually worsening. Patient states symptoms have been exacerbated by recent job loss, conflict with her daughter's father, moving back in with her parents and balancing life as a new parent. Symptoms are associated with intent to harm self threat to overdose on medications. Endorses past history of cutting, but denies suicide attempts. Denies past psychiatric outpatient care, ED visits or hospitalizations. Patient has no physical complaints.      Past Medical History:  Diagnosis Date  . Medical history non-contributory     Patient Active Problem List   Diagnosis Date Noted  . Vaginal delivery 05/22/2018  . Supervision of high risk pregnancy, antepartum, third trimester 05/19/2018  . Chlamydia infection during pregnancy 05/19/2018  . IUGR (intrauterine growth restriction) affecting care of mother 05/19/2018  . Poor fetal growth affecting management of mother in third trimester   . [redacted] weeks gestation of pregnancy   . Encounter for fetal anatomic survey     Past Surgical History:  Procedure Laterality Date  . NO PAST SURGERIES       OB History    Gravida  2   Para  1   Term  1   Preterm      AB  1   Living  1     SAB  1   TAB      Ectopic      Multiple  0   Live Births  1            Home Medications    Prior to Admission medications   Medication Sig Start Date End Date Taking? Authorizing Provider  ibuprofen (ADVIL,MOTRIN) 600 MG tablet Take 1  tablet (600 mg total) by mouth every 6 (six) hours as needed. Patient not taking: Reported on 06/28/2018 05/23/18   Arabella Merles, CNM  Prenatal Vit-Fe Fumarate-FA (PRENATAL VITAMIN PO) Take by mouth.    [provider]    Family History Family History  Problem Relation Age of Onset  . Hypertension Mother   . Hypertension Father   . Diabetes Maternal Grandmother   . Diabetes Paternal Grandmother     Social History Social History   Tobacco Use  . Smoking status: Never Smoker  . Smokeless tobacco: Never Used  Substance Use Topics  . Alcohol use: No    Frequency: Never  . Drug use: No     Allergies   Patient has no known allergies.   Review of Systems Review of Systems  Psychiatric/Behavioral: Positive for sleep disturbance and suicidal ideas.  All other systems reviewed and are negative.    Physical Exam Updated Vital Signs Temp 98 F (36.7 C) (Oral)   Ht 5\' 3"  (1.6 m)   Wt 77.1 kg   LMP 08/16/2017 (Exact Date)   BMI 30.11 kg/m   Physical Exam  Constitutional: She is oriented to person, place, and time. She appears well-developed and well-nourished. No distress.  HENT:  Head: Normocephalic and atraumatic.  Eyes: Pupils are equal, round, and reactive to  light. Conjunctivae and EOM are normal.  Neck: Normal range of motion. Neck supple.  Cardiovascular: Normal rate, regular rhythm, normal heart sounds and intact distal pulses.  Pulmonary/Chest: Effort normal and breath sounds normal.  Musculoskeletal: Normal range of motion.  Neurological: She is alert and oriented to person, place, and time. She displays normal reflexes. No cranial nerve deficit.  Psychiatric:  Depressed mood with congruent affect. Speech is soft. Poor eye contact throughout the interview. Endorses SI. Denies HI and AVH.  Nursing note and vitals reviewed.    ED Treatments / Results  Labs (all labs ordered are listed, but only abnormal results are displayed) Labs Reviewed    COMPREHENSIVE METABOLIC PANEL  ETHANOL  RAPID URINE DRUG SCREEN, HOSP PERFORMED  CBC WITH DIFFERENTIAL/PLATELET  I-STAT BETA HCG BLOOD, ED ([facility], [facility], [facility] ONLY)    EKG None  Radiology No results found.  Procedures Procedures (including critical care time)  Medications Ordered in ED Medications - No data to display   Initial Impression / Assessment and Plan / ED Course  Triage vital signs and the nursing notes have been reviewed.  Pertinent labs & imaging results that were available during care of the patient were reviewed and considered in medical decision making (see chart for details).  Patient with no previous psychiatric treatment presents to the ED with suicidal ideation which is occurring in the post-partum period and in the context of several psychosocial stressors. Patient continues to endorse SI with a plan to overdose on pills. Will consult TTS for further psychiatric evaluation and recommendations on disposition. Clinical Course as of Aug 09 2243  Wynelle LinkSun Aug 08, 2018  2242 Labs unremarkable. From a medical perspective, patient is cleared. TTS evaluated patient. Patient meets criteria for inpatient psychiatric hospitalization.   [GM]    Clinical Course User Index [GM] Mortis, Sharyon MedicusGabrielle I, PA-C    Final Clinical Impressions(s) / ED Diagnoses  1. MDD. TTS evaluated the patient and she meets criteria for inpatient psychiatric hospitalization. Currently no psych beds available. Will order PRN medications for anxiety and insomnia over night for patient.  [facility]. Meets criteria for inpatient psychiatric hospitalization. Pending bed placement.  Final diagnoses:  Severe episode of recurrent major depressive disorder, without psychotic features [facility](HCC)    ED Discharge Orders    None        Reva BoresMortis, Gabrielle I, PA-C 08/08/18 2246    Benjiman CorePickering, Nathan, MD 08/09/18 0005

## 2018-08-08 NOTE — ED Notes (Signed)
Nira ConnJason Berry, NP, patient meets inpatient criteria. Binnie [facility]JoAnn Glover, [facility] no [facility] beds at this time, will re-evaluate in the morning after discharges for bed availability. Arlys JohnBrian, RN, informed of disposition.

## 2018-08-08 NOTE — ED Triage Notes (Signed)
Patient from home, states she has a 242 month old at home and feels very exhausted, and that she's almost at her breaking point. Does express SI-with a plan-to take pills. No HI. Patient calm, cooperative and seeking help "before anything get's worse and out of control."

## 2018-08-09 ENCOUNTER — Other Ambulatory Visit: Payer: Self-pay

## 2018-08-09 ENCOUNTER — Inpatient Hospital Stay (HOSPITAL_COMMUNITY)
Admission: AD | Admit: 2018-08-09 | Discharge: 2018-08-12 | DRG: 885 | Disposition: A | Discharge status: Home / Self Care | Payer: 59 | Source: Intra-hospital | Attending: Psychiatry | Admitting: Psychiatry

## 2018-08-09 ENCOUNTER — Encounter (HOSPITAL_COMMUNITY): Payer: Self-pay | Admitting: *Deleted

## 2018-08-09 DIAGNOSIS — F419 Anxiety disorder, unspecified: Secondary | ICD-10-CM | POA: Diagnosis not present

## 2018-08-09 DIAGNOSIS — R45851 Suicidal ideations: Secondary | ICD-10-CM | POA: Diagnosis present

## 2018-08-09 DIAGNOSIS — Z915 Personal history of self-harm: Secondary | ICD-10-CM

## 2018-08-09 DIAGNOSIS — F4310 Post-traumatic stress disorder, unspecified: Secondary | ICD-10-CM | POA: Diagnosis present

## 2018-08-09 DIAGNOSIS — G4700 Insomnia, unspecified: Secondary | ICD-10-CM | POA: Diagnosis present

## 2018-08-09 DIAGNOSIS — F322 Major depressive disorder, single episode, severe without psychotic features: Secondary | ICD-10-CM | POA: Diagnosis present

## 2018-08-09 DIAGNOSIS — Z62810 Personal history of physical and sexual abuse in childhood: Secondary | ICD-10-CM | POA: Diagnosis present

## 2018-08-09 HISTORY — DX: Anxiety disorder, unspecified: F41.9

## 2018-08-09 HISTORY — DX: Headache: R51

## 2018-08-09 HISTORY — DX: Headache, unspecified: R51.9

## 2018-08-09 MED ORDER — MAGNESIUM HYDROXIDE 400 MG/5ML PO SUSP: 30.0000 mL | Freq: Every day | ORAL | Status: DC | PRN

## 2018-08-09 MED ORDER — ACETAMINOPHEN 325 MG PO TABS
650.0000 mg | ORAL_TABLET | Freq: Four times a day (QID) | ORAL | Status: DC | PRN
  Administered 2018-08-10 – 2018-08-11 (×3): 650 mg via ORAL
  Filled 2018-08-09 (×3): qty 2

## 2018-08-09 MED ORDER — TRAZODONE HCL 50 MG PO TABS
50.0000 mg | ORAL_TABLET | Freq: Every evening | ORAL | Status: DC | PRN
  Administered 2018-08-09 – 2018-08-11 (×3): 50 mg via ORAL
  Filled 2018-08-09 (×3): qty 1

## 2018-08-09 MED ORDER — ALUM & MAG HYDROXIDE-SIMETH 200-200-20 MG/5ML PO SUSP: 30.0000 mL | ORAL | Status: DC | PRN

## 2018-08-09 MED ORDER — HYDROXYZINE HCL 25 MG PO TABS
25.0000 mg | ORAL_TABLET | Freq: Three times a day (TID) | ORAL | Status: DC | PRN
  Administered 2018-08-10 – 2018-08-11 (×2): 25 mg via ORAL
  Filled 2018-08-09 (×2): qty 1

## 2018-08-09 NOTE — ED Notes (Signed)
Pt belongings in locker 3.  

## 2018-08-09 NOTE — ED Provider Notes (Signed)
  22 year old female presenting with suicidal ideations.  Please see previous providers note for full H&P.  Patient resting comfortably in exam bed, she has no physical complaints.  She is awaiting evaluation from behavioral health.  Physical Exam  BP 103/75 (BP Location: Right Arm)   Pulse 90   Temp 98 F (36.7 C) (Oral)   Resp 16   Ht 5\' 3"  (1.6 m)   Wt 77.1 kg   LMP 08/16/2017 (Exact Date)   SpO2 96%   BMI 30.11 kg/m   Physical Exam   Resting comfortably in exam bed in no acute distress  ED Course/Procedures   Clinical Course as of Aug 10 851  Wynelle LinkSun Aug 08, 2018  2242 Labs unremarkable. From a medical perspective, patient is cleared. TTS evaluated patient. Patient meets criteria for inpatient psychiatric hospitalization.   [GM]    Clinical Course User Index [GM] Mortis, Sharyon MedicusGabrielle I, PA-C    Procedures  MDM    22 year old female with suicidal ideation in no acute distress.  No changes to medical management.  Awaiting behavioral health evaluation and disposition.      Eyvonne MechanicHedges, Yajayra Feldt, PA-C 08/09/18 [id number]    Jacalyn LefevreHaviland, Julie, MD 08/09/18 1131

## 2018-08-09 NOTE — Progress Notes (Signed)
[facility] WHEN BED BECOMES AVAILABLE.  [facility]. Nira ConnJason Berry, NP is the accepting provider.  Dr. Nehemiah MassedFernando Cobos, MD is the attending provider.  Call report to [phone]  @ [facility] notified.   Pt is Voluntary.  Pt may be transported by [facility]  [facility].  Timmothy EulerJean T. Kaylyn LimSutter, MSW, LCSWA Disposition Clinical Social Work [phone] (cell) [phone] (office)

## 2018-08-09 NOTE — Tx Team (Signed)
Initial Treatment Plan 08/09/2018 7:24 PM Jamie MyrtleSaperria C Iannone ZOX:[id number]    PATIENT STRESSORS: Traumatic event Other: postpartum depression   PATIENT STRENGTHS: Ability for insight Average or above average intelligence Capable of independent living General fund of knowledge   PATIENT IDENTIFIED PROBLEMS: Depression  Suicidal thoughts "I don't have anyone to talk with, I hold a lot in"                     DISCHARGE CRITERIA:  Ability to meet basic life and health needs Improved stabilization in mood, thinking, and/or behavior Reduction of life-threatening or endangering symptoms to within safe limits Verbal commitment to aftercare and medication compliance  PRELIMINARY DISCHARGE PLAN: Attend aftercare/continuing care group Return to previous living arrangement  PATIENT/FAMILY INVOLVEMENT: This treatment plan has been presented to and reviewed with the patient, Jamie Moore, and/or family member, .  The patient and family have been given the opportunity to ask questions and make suggestions.  Nikita Surman, [place], [place] 08/09/2018, 7:24 PM
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
## 2018-08-10 DIAGNOSIS — G4700 Insomnia, unspecified: Secondary | ICD-10-CM

## 2018-08-10 DIAGNOSIS — F322 Major depressive disorder, single episode, severe without psychotic features: Principal | ICD-10-CM

## 2018-08-10 DIAGNOSIS — F419 Anxiety disorder, unspecified: Secondary | ICD-10-CM

## 2018-08-10 DIAGNOSIS — F4310 Post-traumatic stress disorder, unspecified: Secondary | ICD-10-CM

## 2018-08-10 LAB — TSH: TSH: 3.404 u[IU]/mL (ref 0.350–4.500)

## 2018-08-10 MED ORDER — SERTRALINE HCL 25 MG PO TABS
25.0000 mg | ORAL_TABLET | Freq: Every day | ORAL | Status: DC
  Administered 2018-08-10 – 2018-08-11 (×2): 25 mg via ORAL
  Filled 2018-08-10 (×4): qty 1
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
## 2018-08-10 NOTE — Progress Notes (Signed)
Patient ID: Jamie Moore, female   DOB: Sep 23, 1995, 22 y.o.   MRN: [id number]  Nursing Progress Note [id number]  Data: Patient presents with flat affect and depressed mood. Patient is resting in bed on initial approach. Patient compliant with newly prescribed medication. Patient denies pain/physical complaints. Patient completed self-inventory sheet and rates depression, hopelessness, and anxiety 6,5,8 respectively. Patient rates their sleep and appetite as fair/good respectively. Patient states goal for today is to "learn better ways to cope" and "talk to someone about how I'm feeling". Patient is seen attending groups and visible in the milieu. Patient currently denies SI/HI/AVH.   Action: Patient is educated about and provided medication per provider's orders. Patient safety maintained with q15 min safety checks and frequent rounding. Low fall risk precautions in place. Emotional support given. 1:1 interaction and active listening provided. Patient encouraged to attend meals, groups, and work on treatment plan and goals. Labs, vital signs and patient behavior monitored throughout shift.   Response: Patient remains safe on the unit at this time and agrees to come to staff with any issues/concerns. Patient is interacting with peers appropriately on the unit. Will continue to support and monitor.

## 2018-08-10 NOTE — Plan of Care (Signed)
  Problem: Education: Goal: Knowledge of [facility] Education information/materials will improve Outcome: Progressing   Problem: Safety: Goal: Periods of time without injury will increase Outcome: Progressing   Patient oriented to the unit. Patient remains safe and will continue to monitor.  

## 2018-08-10 NOTE — Plan of Care (Signed)
  Problem: Education: Goal: Knowledge of [facility] Education information/materials will improve Outcome: Progressing   Problem: Activity: Goal: Interest or engagement in activities will improve Outcome: Progressing   Problem: Health Behavior/Discharge Planning: Goal: Compliance with treatment plan for underlying cause of condition will improve Outcome: Progressing   Problem: Safety: Goal: Periods of time without injury will increase Outcome: Progressing   

## 2018-08-10 NOTE — Progress Notes (Signed)
Adult Psychoeducational Group Note  Date:  08/10/2018 Time:  9:29 PM  Group Topic/Focus:  Wrap-Up Group:   The focus of this group is to help patients review their daily goal of treatment and discuss progress on daily workbooks.  Participation Level:  Active  Participation Quality:  Appropriate  Affect:  Appropriate  Cognitive:  Appropriate  Insight: Appropriate  Engagement in Group:  Engaged  Modes of Intervention:  Discussion  Additional Comments:  Pt goal was to learn more coping skills and focus more on the positives in life.  Pt met goal.  Pt rated the day at a 8/10.  Jamie Moore 08/10/2018, 9:29 PM
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
## 2018-08-10 NOTE — Progress Notes (Signed)
Adult Psychoeducational Group Note  Date:  08/10/2018 Time:  9:16 AM  Group Topic/Focus:  Goals Group:   The focus of this group is to help patients establish daily goals to achieve during treatment and discuss how the patient can incorporate goal setting into their daily lives to aide in recovery.  Participation Level:  Active  Participation Quality:  Appropriate  Affect:  Appropriate  Cognitive:  Appropriate  Insight: Good  Engagement in Group:  Engaged  Modes of Intervention:  Discussion  Additional Comments:  Pt attended morning group.   Deforest Hoyleslexandre J Deshay Blumenfeld 08/10/2018, 9:16 AM

## 2018-08-10 NOTE — [facility] Suicide Risk Assessment (Signed)
[facility] INPATIENT:  Family/Significant Other Suicide Prevention Education  Suicide Prevention Education:  Patient Refusal for Family/Significant Other Suicide Prevention Education: The patient Jamie Moore has refused to provide written consent for family/significant other to be provided Family/Significant Other Suicide Prevention Education during admission and/or prior to discharge.  Physician notified.  SPE completed with patient, as patient refused to consent to family contact. Patient was encouraged to share information with support network, ask questions, and talk about any concerns relating to SPE. Patient denies access to guns/firearms and verbalized understanding of information provided. Mobile Crisis information also provided to patient.    Maeola SarahJolan E Dorise Gangi 08/10/2018, 2:46 PM

## 2018-08-10 NOTE — [facility] Group Notes (Signed)
Adult Nursing Psychoeducational Group Note  Date:  08/10/2018 Time:  4:00 PM  Group Topic/Focus: Early Warning Signs Early Warning Signs:   The focus of this group is to help patients identify signs or symptoms they exhibit before slipping into an unhealthy state or crisis.  Participation Level:  Active  Participation Quality:  Appropriate  Affect:  Appropriate  Cognitive:  Alert and Oriented  Insight: Appropriate and Improving  Engagement in Group:  Developing/Improving  Modes of Intervention:  Discussion, Education and Socialization  Additional Comments:  Patient reports her early warning signs included isolating and irritability towards her family.  Jamie Moore 08/10/2018, 4:45 PM

## 2018-08-10 NOTE — Progress Notes (Signed)
D: Patient is alert, oriented, and cooperative. Patient presents with flat facial expression and depressed affect. Patient denies SI, HI, AVH, and verbally contracts for safety. Patient forwards little information during assessment by this RN. Patient rates her anxiety 6/10. Patient denies physical symptoms/pain. Patient requests PRN sleep medication.    A: PRN sleep medication administered per MD order. Support provided. Patient educated on safety on the unit and medications. Routine safety checks every 15 minutes. Patient stated understanding to tell nurse about any new physical symptoms. Patient understands to tell staff of any needs.     R: No adverse drug reactions noted. Patient verbally contracts for safety. Patient remains safe at this time and will continue to monitor.
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
## 2018-08-11 MED ORDER — SERTRALINE HCL 50 MG PO TABS
50.0000 mg | ORAL_TABLET | Freq: Every day | ORAL | Status: DC
  Administered 2018-08-12: 50 mg via ORAL
  Filled 2018-08-11 (×3): qty 1

## 2018-08-11 MED ORDER — SERTRALINE HCL 25 MG PO TABS
25.0000 mg | ORAL_TABLET | Freq: Every day | ORAL | Status: DC
  Administered 2018-08-11: 25 mg via ORAL
  Filled 2018-08-11 (×3): qty 1

## 2018-08-11 NOTE — Progress Notes (Signed)
D:  Jamie Moore was up and visible on the unit.  She reported her day was good because "I haven't been sad."  She denied SI/HI or A/V hallucinations.  She continues to report some anxiety through out the day.  Informed her that she did have some anxiety medication if needed and she did take trazodone and vistaril this evening.  She is currently resting with her eyes closed and appears to be asleep. A:  1:1 with RN for support and encouragement.  Medications as ordered.  Q 15 minute checks maintained for safety.  Encouraged participation in group and unit activities.   R:  [name] remains safe on the unit.  We will continue to monitor the progress towards her goals.

## 2018-08-11 NOTE — Progress Notes (Signed)
Adult Psychoeducational Group Note  Date:  08/11/2018 Time:  10:01 PM  Group Topic/Focus:  Wrap-Up Group:   The focus of this group is to help patients review their daily goal of treatment and discuss progress on daily workbooks.  Participation Level:  Active  Participation Quality:  Appropriate  Affect:  Anxious and Appropriate  Cognitive:  Appropriate  Insight: Appropriate  Engagement in Group:  Developing/Improving  Modes of Intervention:  Discussion  Additional Comments:  Patient stated that she had a good day. Patient shared frustration with family as since the birth of her daughter, she has taken on "other people problems." Patient was sad when her roommate left because she "really liked her." Patient feels that her day was good overall because she was able to engage with other peers, share experiences.  Jamie Moore, [name] 08/11/2018, 10:01 PM

## 2018-08-11 NOTE — Tx Team (Signed)
Interdisciplinary Treatment and Diagnostic Plan Update  08/11/2018 Time of Session:  Jamie MyrtleSaperria C Moore MRN: [id number]  Principal Diagnosis: MDD (major depressive disorder), severe (HCC)  Secondary Diagnoses: Principal Problem:   MDD (major depressive disorder), severe (HCC) Active Problems:   Posttraumatic stress disorder   Current Medications:  Current Facility-Administered Medications  Medication Dose Route Frequency Provider Last Rate Last Dose  . acetaminophen (TYLENOL) tablet 650 mg  650 mg Oral Q6H PRN Money, Gerlene Burdockravis B, FNP   650 mg at 08/11/18 0737  . alum & mag hydroxide-simeth (MAALOX/MYLANTA) 200-200-20 MG/5ML suspension 30 mL  30 mL Oral Q4H PRN Money, Gerlene Burdockravis B, FNP      . hydrOXYzine (ATARAX/VISTARIL) tablet 25 mg  25 mg Oral TID PRN Money, Gerlene Burdockravis B, FNP   25 mg at 08/10/18 2202  . magnesium hydroxide (MILK OF MAGNESIA) suspension 30 mL  30 mL Oral Daily PRN Money, Gerlene Burdockravis B, FNP      . sertraline (ZOLOFT) tablet 25 mg  25 mg Oral Daily Antonieta Pertlary, Greg Lawson, MD   25 mg at 08/11/18 1158  . [START ON 08/12/2018] sertraline (ZOLOFT) tablet 50 mg  50 mg Oral Daily Antonieta Pertlary, Greg Lawson, MD      . traZODone (DESYREL) tablet 50 mg  50 mg Oral QHS PRN Money, Gerlene Burdockravis B, FNP   50 mg at 08/10/18 2114   PTA Medications: Medications Prior to Admission  Medication Sig Dispense Refill Last Dose  . ibuprofen (ADVIL,MOTRIN) 600 MG tablet Take 1 tablet (600 mg total) by mouth every 6 (six) hours as needed. (Patient not taking: Reported on 06/28/2018) 30 tablet 0 Not Taking at Unknown time    Patient Stressors: Traumatic event Other: postpartum depression  Patient Strengths: Ability for insight Average or above average intelligence Capable of independent living General fund of knowledge  Treatment Modalities: Medication Management, Group therapy, Case management,  1 to 1 session with clinician, Psychoeducation, Recreational therapy.   Physician Treatment Plan for Primary Diagnosis: MDD  (major depressive disorder), severe (HCC) Long Term Goal(s): Improvement in symptoms so as ready for discharge Improvement in symptoms so as ready for discharge   Short Term Goals: Ability to identify changes in lifestyle to reduce recurrence of condition will improve Ability to verbalize feelings will improve Ability to disclose and discuss suicidal ideas Ability to demonstrate self-control will improve Ability to identify and develop effective coping behaviors will improve Ability to maintain clinical measurements within normal limits will improve Ability to identify changes in lifestyle to reduce recurrence of condition will improve Ability to verbalize feelings will improve Ability to disclose and discuss suicidal ideas Ability to demonstrate self-control will improve Ability to identify and develop effective coping behaviors will improve Ability to maintain clinical measurements within normal limits will improve  Medication Management: Evaluate patient's response, side effects, and tolerance of medication regimen.  Therapeutic Interventions: 1 to 1 sessions, Unit Group sessions and Medication administration.  Evaluation of Outcomes: Progressing  Physician Treatment Plan for Secondary Diagnosis: Principal Problem:   MDD (major depressive disorder), severe (HCC) Active Problems:   Posttraumatic stress disorder  Long Term Goal(s): Improvement in symptoms so as ready for discharge Improvement in symptoms so as ready for discharge   Short Term Goals: Ability to identify changes in lifestyle to reduce recurrence of condition will improve Ability to verbalize feelings will improve Ability to disclose and discuss suicidal ideas Ability to demonstrate self-control will improve Ability to identify and develop effective coping behaviors will improve Ability to maintain clinical measurements  within normal limits will improve Ability to identify changes in lifestyle to reduce recurrence  of condition will improve Ability to verbalize feelings will improve Ability to disclose and discuss suicidal ideas Ability to demonstrate self-control will improve Ability to identify and develop effective coping behaviors will improve Ability to maintain clinical measurements within normal limits will improve     Medication Management: Evaluate patient's response, side effects, and tolerance of medication regimen.  Therapeutic Interventions: 1 to 1 sessions, Unit Group sessions and Medication administration.  Evaluation of Outcomes: Progressing   RN Treatment Plan for Primary Diagnosis: MDD (major depressive disorder), severe (HCC) Long Term Goal(s): Knowledge of disease and therapeutic regimen to maintain health will improve  Short Term Goals: Ability to participate in decision making will improve, Ability to verbalize feelings will improve, Ability to disclose and discuss suicidal ideas and Ability to identify and develop effective coping behaviors will improve  Medication Management: RN will administer medications as ordered by provider, will assess and evaluate patient's response and provide education to patient for prescribed medication. RN will report any adverse and/or side effects to prescribing provider.  Therapeutic Interventions: 1 on 1 counseling sessions, Psychoeducation, Medication administration, Evaluate responses to treatment, Monitor vital signs and CBGs as ordered, Perform/monitor CIWA, COWS, AIMS and Fall Risk screenings as ordered, Perform wound care treatments as ordered.  Evaluation of Outcomes: Progressing   LCSW Treatment Plan for Primary Diagnosis: MDD (major depressive disorder), severe (HCC) Long Term Goal(s): Safe transition to appropriate next level of care at discharge, Engage patient in therapeutic group addressing interpersonal concerns.  Short Term Goals: Engage patient in aftercare planning with referrals and resources  Therapeutic Interventions:  Assess for all discharge needs, 1 to 1 time with Social worker, Explore available resources and support systems, Assess for adequacy in community support network, Educate family and significant other(s) on suicide prevention, Complete Psychosocial Assessment, Interpersonal group therapy.  Evaluation of Outcomes: Adequate for Discharge   Progress in Treatment: Attending groups: Yes. Participating in groups: Yes. Taking medication as prescribed: Yes. Toleration medication: Yes. Family/Significant other contact made: No, will contact:  patient declines consenmt Patient understands diagnosis: Yes. Discussing patient identified problems/goals with staff: Yes. Medical problems stabilized or resolved: Yes. Denies suicidal/homicidal ideation: Yes. Issues/concerns per patient self-inventory: No. Other:   New problem(s) identified: None   New Short Term/Long Term Goal(s): medication stabilization, elimination of SI thoughts, development of comprehensive mental wellness plan.    Patient Goals:  Help with depression and suicidal thoughts   Discharge Plan or Barriers: Patient plans to discharge home with her grandparents and follow up with [facility] for medication management and therapy services.   Reason for Continuation of Hospitalization: Anxiety Depression Medication stabilization  Estimated Length of Stay: 3-5 days   Attendees: Patient: 08/11/2018 2:49 PM  Physician: Dr. Landry Mellow, MD 08/11/2018 2:49 PM  Nursing: Rodell Perna.Lacretia Nicks, RN 08/11/2018 2:49 PM  RN Care Manager: 08/11/2018 2:49 PM  Social Worker: Baldo Daub, LCSWA 08/11/2018 2:49 PM  Recreational Therapist:  08/11/2018 2:49 PM  Other:  08/11/2018 2:49 PM  Other:  08/11/2018 2:49 PM  Other: 08/11/2018 2:49 PM    Scribe for Treatment Team: Maeola Sarah, LCSWA 08/11/2018 2:49 PM

## 2018-08-11 NOTE — Progress Notes (Signed)
[facility]  08/11/2018 10:59 AM Jamie Moore  MRN:  [id number] Subjective:  Patient is seen and examined. Patient is a 22 year old female with a probable past psychiatric history significant for posttraumatic stress disorder and major depression. The patient presented [facility] 08[facility]08/2018 to [facility] complaining of depression and suicidal ideation.  Objective: Patient is seen and examined.  Patient is a 22 year old female with the above-stated past psychiatric history who is seen in follow-up.  She stated she is a bit fatigued today.  She got the trazodone last night, and it sounds like she is having some Benadryl hangover type problems.  We discussed that.  She stated that she was not having any suicidal ideation.  She stated her mood was slightly better.  She denied any side effects to her medications except some oversedation.  She stated she was concerned that she might have bipolar disorder after 1 of her groups today.  We discussed that.  She denied any episodes of euphoria, excessive spending, or increased goal-directed activity over a 2 to 3-day period of time.  She does have a family history of bipolar disorder.  She slept 6.25 hours last night.  Vital signs are stable, she is afebrile.  Review of her laboratories revealed no major abnormalities.  Principal Problem: MDD (major depressive disorder), severe (HCC) Diagnosis: Principal Problem:   MDD (major depressive disorder), severe (HCC) Active Problems:   Posttraumatic stress disorder  Total Time spent with patient: 20 minutes  Past Psychiatric History: See admission H&P  Past Medical History:  Past Medical History:  Diagnosis Date  . Anxiety   . Headache   . Medical history non-contributory     Past Surgical History:  Procedure Laterality Date  . NO PAST SURGERIES     Family History:  Family History  Problem Relation Age of Onset  . Hypertension Mother   . Hypertension Father   .  Diabetes Maternal Grandmother   . Diabetes Paternal Grandmother    Family Psychiatric  History: See admission H&P Social History:  Social History   Substance and Sexual Activity  Alcohol Use No  . Frequency: Never     Social History   Substance and Sexual Activity  Drug Use No    Social History   Socioeconomic History  . Marital status: Single    Spouse name: Not on file  . Number of children: Not on file  . Years of education: Not on file  . Highest education level: Not on file  Occupational History  . Not on file  Social Needs  . Financial resource strain: Not hard at all  . Food insecurity:    Worry: Never true    Inability: Never true  . Transportation needs:    Medical: No    Non-medical: No  Tobacco Use  . Smoking status: Never Smoker  . Smokeless tobacco: Never Used  Substance and Sexual Activity  . Alcohol use: No    Frequency: Never  . Drug use: No  . Sexual activity: Yes    Birth control/protection: None  Lifestyle  . Physical activity:    Days per week: Not on file    Minutes per session: Not on file  . Stress: Not at all  Relationships  . Social connections:    Talks on phone: More than three times a week    Gets together: More than three times a week    Attends religious service: 1 to 4 times per year  Active member of club or organization: No    Attends meetings of clubs or organizations: Never    Relationship status: Patient refused  Other Topics Concern  . Not on file  Social History Narrative  . Not on file   Additional Social History:                         Sleep: Good  Appetite:  Fair  Current Medications: Current Facility-Administered Medications  Medication Dose Route Frequency Provider Last Rate Last Dose  . acetaminophen (TYLENOL) tablet 650 mg  650 mg Oral Q6H PRN Money, Gerlene Burdock, FNP   650 mg at 08/11/18 0737  . alum & mag hydroxide-simeth (MAALOX/MYLANTA) 200-200-20 MG/5ML suspension 30 mL  30 mL Oral Q4H  PRN Money, Gerlene Burdock, FNP      . hydrOXYzine (ATARAX/VISTARIL) tablet 25 mg  25 mg Oral TID PRN Money, Gerlene Burdock, FNP   25 mg at 08/10/18 2202  . magnesium hydroxide (MILK OF MAGNESIA) suspension 30 mL  30 mL Oral Daily PRN Money, Gerlene Burdock, FNP      . sertraline (ZOLOFT) tablet 25 mg  25 mg Oral Daily Antonieta Pert, MD      . Melene Muller ON 08/12/2018] sertraline (ZOLOFT) tablet 50 mg  50 mg Oral Daily Antonieta Pert, MD      . traZODone (DESYREL) tablet 50 mg  50 mg Oral QHS PRN Money, Gerlene Burdock, FNP   50 mg at 08/10/18 2114    Lab Results:  Results for orders placed or performed during the hospital encounter of 08/09/18 (from the past 48 hour(s))  TSH     Status: None   Collection Time: 08/10/18  6:44 AM  Result Value Ref Range   TSH 3.404 0.350 - 4.500 uIU/mL    Comment: Performed by a 3rd Generation assay with a functional sensitivity of <=0.01 uIU/mL. Performed at [facility], [address], [place], [place] [address]     Blood Alcohol level:  Lab Results  Component Value Date   ETH <10 08/08/2018    Metabolic Disorder Labs: Lab Results  Component Value Date   HGBA1C 5.0 05/19/2018   MPG 96.8 05/19/2018   No results found for: PROLACTIN No results found for: CHOL, TRIG, HDL, CHOLHDL, VLDL, LDLCALC  Physical Findings: AIMS: Facial and Oral Movements Muscles of Facial Expression: None, normal Lips and Perioral Area: None, normal Jaw: None, normal Tongue: None, normal,Extremity Movements Upper (arms, wrists, hands, fingers): None, normal Lower (legs, knees, ankles, toes): None, normal, Trunk Movements Neck, shoulders, hips: None, normal, Overall Severity Severity of abnormal movements (highest score from questions above): None, normal Incapacitation due to abnormal movements: None, normal Patient's awareness of abnormal movements (rate only patient's report): No Awareness, Dental Status Current problems with teeth and/or dentures?: No Does  patient usually wear dentures?: No  CIWA:    COWS:     Musculoskeletal: Strength & Muscle Tone: within normal limits Gait & Station: normal Patient leans: N/A  Psychiatric Specialty Exam: Physical Exam  Nursing note and vitals reviewed. Constitutional: She is oriented to person, place, and time. She appears well-developed and well-nourished.  HENT:  Head: Normocephalic and atraumatic.  Respiratory: Effort normal.  Neurological: She is alert and oriented to person, place, and time.    ROS  Blood pressure 112/74, pulse 95, temperature 97.6 F (36.4 C), temperature source Oral, resp. rate 20, height 5' 3.5" (1.613 m), weight 78.5 kg, last menstrual period  08/16/2017, not currently breastfeeding.Body mass index is 30.16 kg/m.  General Appearance: Casual  Eye Contact:  Fair  Speech:  Normal Rate  Volume:  Decreased  Mood:  Anxious and Depressed  Affect:  Congruent  Thought Process:  Coherent and Descriptions of Associations: Intact  Orientation:  Full (Time, Place, and Person)  Thought Content:  Logical  Suicidal Thoughts:  No  Homicidal Thoughts:  No  Memory:  Immediate;   Fair Recent;   Fair Remote;   Fair  Judgement:  Intact  Insight:  Fair  Psychomotor Activity:  Decreased  Concentration:  Concentration: Fair and Attention Span: Fair  Recall:  Good  Fund of Knowledge:  Good  Language:  Good  Akathisia:  Negative  Handed:  Right  AIMS (if indicated):     Assets:  Communication Skills Desire for Improvement Financial Resources/Insurance Housing Physical Health Resilience Social Support  ADL's:  Intact  Cognition:  WNL  Sleep:  Number of Hours: 6.25     Treatment Plan Summary: Daily contact with patient to assess and evaluate symptoms and progress in treatment, Medication management and Plan : Patient is seen and examined.  Patient is a 22 year old female with a probable past psychiatric history significant for post traumatic stress disorder as well as major  depression.   #1 increase sertraline to 50 mg p.o. daily for mood and anxiety #2 continue hydroxyzine 25 mg p.o. 3 times daily as needed anxiety or insomnia. #3 continue trazodone 50 mg p.o. nightly as needed insomnia, but encourage patient to try hydroxyzine versus trazodone secondary to side effects reported #4 disposition planning-in progress.  Antonieta Pert, MD 08/11/2018, 10:59 AM

## 2018-08-11 NOTE — Plan of Care (Signed)
  Problem: Activity: Goal: Sleeping patterns will improve Outcome: Progressing Note:  She reported feeling groggy in the morning from the trazodone.  She took her trazodone earlier tonight.  We will continue to monitor.

## 2018-08-11 NOTE — Therapy (Signed)
Adult Psychoeducational Group Note  Date:  08/11/2018 Time:  2:33 PM  Group Topic/Focus:  Self Esteem Action Plan:   The focus of this group is to help patients create a plan to continue to build self-esteem after discharge.  Participation Level:  Active  Participation Quality:  Appropriate  Affect:  Blunted  Cognitive:  Appropriate  Insight: Improving  Engagement in Group:  Engaged  Modes of Intervention:  Activity, Discussion, Education and Socialization  Additional Comments:    S: "My self esteem is higher when I feel heard and appreciated"  O: OT tx with focus on self esteem building this date. Education given on definition of self esteem, with both causes of low and high self esteem identified. Activity given for pt to identify a positive/aspiring trait for each letter of the alphabet. Pt to work with peers to help complete activity and build positive thinking.   A: Pt presents to group with appropriate affect, engaged and participatory throughout session. Pt helped add to self esteem education by sharing with peers. A-z activity completed with min-mod VC's. Pt appropriately brainstorming with other peers for support. Noted improved affect after completion of activity.   P: Education given on self esteem and how to improve this date. Handouts and activities given to help facilitate skills when reintegrating into community.   Dalphine HandingKaylee Brelynn Wheller, MSOT, OTR/L Behavioral Health OT/ Acute Relief OT PHP Office: [phone]  Dalphine HandingKaylee Tomara Youngberg 08/11/2018, 2:33 PM

## 2018-08-11 NOTE — Progress Notes (Signed)
Pt observed in the dayroom, attending wrap-up group. Pt appears anxious/depressed in affect and mood. Pt states her day was okay. Pt states she was sad that one of her peers left. Pt denies SI/HI/AVH/Pain at this time. PRN trazodone and vistaril requested and given. Support and encouragement offered. Will continue with POC.

## 2018-08-11 NOTE — Progress Notes (Signed)
Pt presents with a flat affect and depressed mood. Pt rated on her self inventory sheet depression 2/10, anxiety 4/10 and hopelessness 0. Pt denies SI/HI. Pt stated goal "communication". Pt compliant with taking meds and no side effects verbalized by pt. Pt compliant with attending groups.  Medications administered as ordered per MD. Pt Zoloft increased and education provided. Verbal support provided. 15 minute checks performed for safety.  Pt compliant with tx pla.

## 2018-08-11 NOTE — Progress Notes (Signed)
Recreation Therapy Notes  Date: 11.20.19 Time: 0930 Location: 300 [address] Dayroom  Group Topic: Stress Management  Goal Area(s) Addresses:  Patient will verbalize importance of using healthy stress management.  Patient will identify positive emotions associated with healthy stress management.   Intervention: Stress Management  Activity :  Guided Imagery.  LRT introduced the stress management technique of guided imagery.  LRT read a script to guide patients through a peaceful meadow.  Patients were to follow along as script was read to engage in activity.  Education:  Stress Management, Discharge Planning.   Education Outcome: Acknowledges edcuation/In group clarification offered/Needs additional education  Clinical Observations/Feedback: Pt did not attend group.    Caroll RancherMarjette Yuleni Burich, LRT/CTRS         Caroll RancherLindsay, Merlen Gurry A 08/11/2018 11:26 AM

## 2018-08-12 MED ORDER — HYDROXYZINE HCL 25 MG PO TABS: 25.0000 mg | ORAL_TABLET | Freq: Three times a day (TID) | ORAL | 0 refills | Status: DC | PRN

## 2018-08-12 MED ORDER — TRAZODONE HCL 50 MG PO TABS: 50.0000 mg | ORAL_TABLET | Freq: Every evening | ORAL | 0 refills | Status: DC | PRN

## 2018-08-12 MED ORDER — IBUPROFEN 600 MG PO TABS: 600.0000 mg | ORAL_TABLET | Freq: Four times a day (QID) | ORAL | 0 refills | Status: DC | PRN

## 2018-08-12 MED ORDER — SERTRALINE HCL 50 MG PO TABS: 50.0000 mg | ORAL_TABLET | Freq: Every day | ORAL | 0 refills | Status: DC

## 2018-08-12 NOTE — Progress Notes (Signed)
  [facility] :  Will you be returning to the same living situation after discharge:  Yes,  patient reports she is returning home with her grandparents At discharge, do you have transportation home?: Yes,  patient reports that her father is picking her up at discharge Do you have the ability to pay for your medications: No.  Release of information consent forms completed and in the chart;  Patient's signature needed at discharge.  Patient to Follow up at: Follow-up Information    [facility] on 08/24/2018.   Why:  Therapy appointment with Army MeliaKim Hines is Tuesday, 08/24/18 at 9:00a.  Medication management appointment with Elon JesterMichele is Wednesday, 08/25/18 at 3:00p.  Call within 24 hours of discharge to hold appointment and pay $20 deposit. Bring photo ID and discharge info Contact information: [address] [place] [address] [phone]           Next level of care provider [facility] Link:yes  Safety Planning and Suicide Prevention discussed: Yes,  with the patient   Have you used any form of tobacco in the last 30 days? (Cigarettes, Smokeless Tobacco, Cigars, and/or Pipes): No  Has patient been referred to the Quitline?: N/A patient is not a smoker  Patient has been referred for addiction treatment: N/A  Maeola SarahJolan E Sherrie Marsan, LCSWA 08/12/2018, 10:11 AM

## 2018-08-12 NOTE — [facility] Suicide Risk Assessment (Signed)
[facility]   Principal Problem: MDD (major depressive disorder), severe (HCC) Discharge Diagnoses: Principal Problem:   MDD (major depressive disorder), severe (HCC) Active Problems:   Posttraumatic stress disorder   Total Time spent with patient: 20 minutes  Musculoskeletal: Strength & Muscle Tone: within normal limits Gait & Station: normal Patient leans: N/A  Psychiatric Specialty Exam: Review of Systems  All other systems reviewed and are negative.   Blood pressure (!) 112/91, pulse (!) 102, temperature 97.9 F (36.6 C), temperature source Oral, resp. rate 20, height 5' 3.5" (1.613 m), weight 78.5 kg, last menstrual period 08/16/2017, not currently breastfeeding.Body mass index is 30.16 kg/m.  General Appearance: Casual  Eye Contact::  Good  Speech:  Normal Rate409  Volume:  Decreased  Mood:  Euthymic  Affect:  Congruent  Thought Process:  Coherent and Descriptions of Associations: Intact  Orientation:  Full (Time, Place, and Person)  Thought Content:  Logical  Suicidal Thoughts:  No  Homicidal Thoughts:  No  Memory:  Immediate;   Fair Recent;   Fair Remote;   Fair  Judgement:  Intact  Insight:  Fair  Psychomotor Activity:  Normal  Concentration:  Good  Recall:  Good  Fund of Knowledge:Good  Language: Good  Akathisia:  Negative  Handed:  Right  AIMS (if indicated):     Assets:  Communication Skills Desire for Improvement Financial Resources/Insurance Housing Physical Health Resilience Social Support Talents/Skills  Sleep:  Number of Hours: 6.75  Cognition: WNL  ADL's:  Intact   Mental Status Per Nursing Assessment::   On Admission:  NA  Demographic Factors:  Adolescent or young adult, Low socioeconomic status and Unemployed  Loss Factors: NA  Historical Factors: Impulsivity  Risk Reduction Factors:   Responsible for children under 20 years of age, Sense of responsibility to family, Living with another person,  especially a relative and Positive social support  Continued Clinical Symptoms:  Depression:   Impulsivity  Cognitive Features That Contribute To Risk:  None    Suicide Risk:  Minimal: No identifiable suicidal ideation.  Patients presenting with no risk factors but with morbid ruminations; may be classified as minimal risk based on the severity of the depressive symptoms  Follow-up Information    [facility] Go on 08/24/2018.   Why:  Therapy appointment with Army MeliaKim Hines is Tuesday, 08/24/18 at 9:00a.  Medication management appointment with Elon JesterMichele is Wednesday, 08/25/18 at 3:00p.  Call within 24 hours of discharge to hold appointment and pay $20 deposit. Bring photo ID and insurance card Contact information: [address] [place] [address] [phone]           Plan Of Care/Follow-up recommendations:  Activity:  ad lib  Antonieta PertGreg Lawson , MD 08/12/2018, 9:24 AM

## 2018-08-12 NOTE — Progress Notes (Signed)
D:  Patient's self inventory sheet, patient has fair sleep, sleep medication helpful.  Good appetite, high energy level, good concentration.  Rated depression 2, denied hopeless, anxiety 6.  Denied SI, contracts for safety.  Denied physical problems.  Denied physical pain.  Goal is communication and just not holding everything in.  Plans to talk about what's going on, speak up.  Does have discharge plans. A:  Medications administered per MD orders.  Emotional support and encouragement given patient. R:  Patient denied SI and HI, contracts for safety.  Denied A/V hallucinations.  Safety maintained with 15 minute checks.
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
## 2018-08-12 NOTE — Progress Notes (Signed)
Discharge Note:  Patient discharged with family member.  Patient denied SI and HI.  Denied A/V hallucinations.  Suicide prevention information and My3 information given to patient at discharge.  Patient stated she understood this information and had no questions.  Patient stated she received all her belongings, clothing, misc items, toiletries, prescriptions, etc.  Patient [facility].  All required discharge information given to patient at discharge.  Survey information given to patient also.

## 2018-09-08 ENCOUNTER — Ambulatory Visit: Payer: Self-pay | Admitting: Nurse Practitioner

## 2018-09-28 ENCOUNTER — Other Ambulatory Visit: Payer: Self-pay

## 2018-09-28 ENCOUNTER — Encounter (HOSPITAL_BASED_OUTPATIENT_CLINIC_OR_DEPARTMENT_OTHER): Payer: Self-pay | Admitting: Emergency Medicine

## 2018-09-28 ENCOUNTER — Emergency Department (HOSPITAL_BASED_OUTPATIENT_CLINIC_OR_DEPARTMENT_OTHER)
Admission: EM | Admit: 2018-09-28 | Discharge: 2018-09-28 | Disposition: A | Discharge status: Home / Self Care | Payer: Managed Care, Other (non HMO) | Attending: Emergency Medicine | Admitting: Emergency Medicine

## 2018-09-28 DIAGNOSIS — Z79899 Other long term (current) drug therapy: Secondary | ICD-10-CM | POA: Insufficient documentation

## 2018-09-28 DIAGNOSIS — Z7689 Persons encountering health services in other specified circumstances: Secondary | ICD-10-CM

## 2018-09-28 DIAGNOSIS — Z202 Contact with and (suspected) exposure to infections with a predominantly sexual mode of transmission: Secondary | ICD-10-CM | POA: Diagnosis not present

## 2018-09-28 DIAGNOSIS — M25532 Pain in left wrist: Secondary | ICD-10-CM | POA: Insufficient documentation

## 2018-09-28 DIAGNOSIS — N898 Other specified noninflammatory disorders of vagina: Secondary | ICD-10-CM | POA: Diagnosis present

## 2018-09-28 LAB — URINALYSIS, COMPLETE (UACMP) WITH MICROSCOPIC
Bilirubin Urine: NEGATIVE
GLUCOSE, UA: NEGATIVE mg/dL
Hgb urine dipstick: NEGATIVE
Ketones, ur: NEGATIVE mg/dL
Nitrite: NEGATIVE
Protein, ur: 30 mg/dL — AB
SPECIFIC GRAVITY, URINE: 1.025 (ref 1.005–1.030)
WBC, UA: >50 WBC/hpf (ref 0–5)
pH: 6 (ref 5.0–8.0)

## 2018-09-28 LAB — PREGNANCY, URINE: PREG TEST UR: NEGATIVE

## 2018-09-28 NOTE — Discharge Instructions (Addendum)
Please call the health department for STI evaluation  Please call Dr Alvira Monday for

## 2018-09-28 NOTE — ED Triage Notes (Signed)
Pt c/o vaginal discharge and states she was exposed to gonorrhea. Also L wrist pain with swelling for several days. Denies injury.

## 2018-09-28 NOTE — ED Provider Notes (Signed)
[facility] EMERGENCY DEPARTMENT Provider Note   CSN: [id number] Arrival date & time: 09/28/18  1303     History   Chief Complaint Chief Complaint  Patient presents with  . Vaginal Discharge  . Wrist Pain    HPI Jamie Moore is a 23 y.o. female.  HPI 23 year old female presents to the emergency department with complaints of ongoing left wrist pain since September 16, 2018.  She reports that she awoke with pain at that time.  She was seen in outside hospital and underwent x-rays.  She has been wearing a splint from her mother.  She continues having pain of the radial aspect of her left wrist with pain and a popping sensation in her left thumb.  No injury or trauma.  No heavy lifting.  She denies fevers or chills.  No significant swelling or redness.  She also reports that she was recently informed from her boyfriend that he had been exposed to gonorrhea.  She reports 4 to 5 days of scant vaginal discharge without fever chills or abdominal pain.  She would like to be checked for STDs given her recent reported exposure    Past Medical History:  Diagnosis Date  . Anxiety   . Headache   . Medical history non-contributory     Patient Active Problem List   Diagnosis Date Noted  . Posttraumatic stress disorder   . MDD (major depressive disorder), severe (HCC) 08/09/2018  . Vaginal delivery 05/22/2018  . Supervision of high risk pregnancy, antepartum, third trimester 05/19/2018  . Chlamydia infection during pregnancy 05/19/2018  . IUGR (intrauterine growth restriction) affecting care of mother 05/19/2018  . Poor fetal growth affecting management of mother in third trimester   . [redacted] weeks gestation of pregnancy   . Encounter for fetal anatomic survey     Past Surgical History:  Procedure Laterality Date  . NO PAST SURGERIES       OB History    Gravida  2   Para  1   Term  1   Preterm      AB  1   Living  1     SAB  1   TAB      Ectopic      Multiple  0   Live Births  1            Home Medications    Prior to Admission medications   Medication Sig Start Date End Date Taking? Authorizing Provider  hydrOXYzine (ATARAX/VISTARIL) 25 MG tablet Take 1 tablet (25 mg total) by mouth 3 (three) times daily as needed for anxiety. 08/12/18   Armandina StammerNwoko, Agnes I, NP  ibuprofen (ADVIL,MOTRIN) 600 MG tablet Take 1 tablet (600 mg total) by mouth every 6 (six) hours as needed. (May buy from over the counter): For pain 08/12/18   Armandina StammerNwoko, Agnes I, NP  sertraline (ZOLOFT) 50 MG tablet Take 1 tablet (50 mg total) by mouth daily. For depression 08/13/18   Armandina StammerNwoko, Agnes I, NP  traZODone (DESYREL) 50 MG tablet Take 1 tablet (50 mg total) by mouth at bedtime as needed for sleep. 08/12/18   Sanjuana KavaNwoko, Agnes I, NP    Family History Family History  Problem Relation Age of Onset  . Hypertension Mother   . Hypertension Father   . Diabetes Maternal Grandmother   . Diabetes Paternal Grandmother     Social History Social History   Tobacco Use  . Smoking status: Never Smoker  . Smokeless tobacco: Never Used  Substance Use Topics  . Alcohol use: No    Frequency: Never  . Drug use: No     Allergies   Patient has no known allergies.   Review of Systems Review of Systems  All other systems reviewed and are negative.    Physical Exam Updated Vital Signs BP 123/71 (BP Location: Left Arm)   Pulse 95   Temp 98.1 F (36.7 C) (Oral)   Resp 16   Ht 5\' 3"  (1.6 m)   Wt 78.5 kg   LMP 09/14/2018   SpO2 99%   BMI 30.65 kg/m   Physical Exam Vitals signs and nursing note reviewed.  Constitutional:      Appearance: She is well-developed.  HENT:     Head: Normocephalic.  Neck:     Musculoskeletal: Normal range of motion.  Pulmonary:     Effort: Pulmonary effort is normal.  Abdominal:     General: There is no distension.     Palpations: Abdomen is soft. There is no mass.     Tenderness: There is no abdominal tenderness. There is no guarding  or rebound.  Musculoskeletal:     Comments: Normal left radial pulse.  Full range of motion of left wrist.  No swelling of the left upper extremity as compared to the right.  Normal grip strength left hand.  Full range of motion of all 5 fingers in the left hand with normal flexion and extension at the left thumb  Neurological:     Mental Status: She is alert and oriented to person, place, and time.      ED Treatments / Results  Labs (all labs ordered are listed, but only abnormal results are displayed) Labs Reviewed  WET PREP, GENITAL  PREGNANCY, URINE  URINALYSIS, COMPLETE (UACMP) WITH MICROSCOPIC  GC/CHLAMYDIA PROBE AMP ([place]) NOT AT [facility]    EKG None  Radiology No results found.  Procedures Procedures (including critical care time)  Medications Ordered in ED Medications - No data to display   Initial Impression / Assessment and Plan / ED Course  I have reviewed the triage vital signs and the nursing notes.  Pertinent labs & imaging results that were available during my care of the patient were reviewed by me and considered in my medical decision making (see chart for details).     Left thumb likely represents tendinitis.  Patient with a over-the-counter splint which is reasonable at this time.  Recommended elevation and anti-inflammatories.  Given her persistent symptoms for over 2 weeks and a negative x-ray at the outside hospital I think she will need follow-up with sports medicine specialist.  This likely represents tendinitis.  In regards to her possible STD exposure I have referred her to [facility] department for appropriate STI screening and evaluation.  Medical screening examination complete.  No life-threatening emergency.  Doubt PID.  Stable vital signs.  Abdominal exam is benign.  No indication for additional work-up at this time.  Referral to the appropriate location for STI screening  Final Clinical Impressions(s) / ED Diagnoses   Final  diagnoses:  Acute wrist pain, left  Encounter for assessment of STD exposure    ED Discharge Orders    None       Azalia Bilis, MD 09/28/18 1353

## 2018-09-29 ENCOUNTER — Encounter: Payer: Self-pay | Admitting: Nurse Practitioner

## 2018-09-29 ENCOUNTER — Ambulatory Visit (INDEPENDENT_AMBULATORY_CARE_PROVIDER_SITE_OTHER): Payer: Managed Care, Other (non HMO) | Admitting: Nurse Practitioner

## 2018-09-29 ENCOUNTER — Telehealth: Payer: Self-pay | Admitting: Medical

## 2018-09-29 VITALS — BP 120/89 | HR 92 | Wt 174.0 lb

## 2018-09-29 DIAGNOSIS — Z202 Contact with and (suspected) exposure to infections with a predominantly sexual mode of transmission: Secondary | ICD-10-CM

## 2018-09-29 DIAGNOSIS — Z30017 Encounter for initial prescription of implantable subdermal contraceptive: Secondary | ICD-10-CM | POA: Diagnosis not present

## 2018-09-29 DIAGNOSIS — Z113 Encounter for screening for infections with a predominantly sexual mode of transmission: Secondary | ICD-10-CM

## 2018-09-29 DIAGNOSIS — N898 Other specified noninflammatory disorders of vagina: Secondary | ICD-10-CM | POA: Diagnosis not present

## 2018-09-29 LAB — POCT PREGNANCY, URINE: Preg Test, Ur: NEGATIVE

## 2018-09-29 MED ORDER — ETONOGESTREL 68 MG ~~LOC~~ IMPL
68.0000 mg | DRUG_IMPLANT | Freq: Once | SUBCUTANEOUS | Status: AC
  Administered 2018-09-29: 68 mg via SUBCUTANEOUS

## 2018-09-29 MED ORDER — CEFTRIAXONE SODIUM 250 MG IJ SOLR
250.0000 mg | Freq: Once | INTRAMUSCULAR | Status: AC
  Administered 2018-09-29: 250 mg via INTRAMUSCULAR

## 2018-09-29 MED ORDER — AZITHROMYCIN 250 MG PO TABS
1000.0000 mg | ORAL_TABLET | Freq: Every day | ORAL | Status: DC
  Administered 2018-09-29: 1000 mg via ORAL

## 2018-09-29 NOTE — Patient Instructions (Signed)
Gonorrhea Gonorrhea is a sexually transmitted disease (STD) that can affect both men and women. If left untreated, this infection can:  Damage the female or female organs.  Cause women and men to be unable to have children (be sterile).  Harm a fetus if an infected woman is pregnant. It is important to get treatment for gonorrhea as soon as possible. It is also necessary for all of your sexual partners to be tested for the infection. What are the causes? This condition is caused by bacteria called Neisseria gonorrhoeae. The infection is spread from person to person through sexual contact, including oral, anal, and vaginal sex. A newborn can contract the infection from his or her mother during birth. What increases the risk? The following factors may make you more likely to develop this condition:  Being a woman who is younger than 21 years of age and who is sexually active.  Being a woman 25 years of age or older who has: ? A new sex partner. ? More than one sex partner. ? A sex partner who has an STD.  Being a man who has: ? A new sex partner. ? More than one sex partner. ? A sex partner who has an STD.  Using condoms inconsistently.  Currently having, or having previously had, an STD.  Exchanging sex for money or drugs. What are the signs or symptoms? Some people do not have any symptoms. If you do have symptoms, they may be different for females and males. For females  Pain in the lower abdomen.  Abnormal vaginal discharge. The discharge may be cloudy, thick, or yellow-green in color.  Bleeding between periods.  Painful sex.  Burning or itching in and around the vagina.  Pain or burning when urinating.  Irritation, pain, bleeding, or discharge from the rectum. This may occur if the infection was spread by anal sex.  Sore throat or swollen lymph nodes in the neck. This may occur if the infection was spread by oral sex. For males  Abnormal discharge from the penis.  This discharge may be cloudy, thick, or yellow-green in color.  Pain or burning during urination.  Pain or swelling in the testicles.  Irritation, pain, bleeding, or discharge from the rectum. This may occur if the infection was spread by anal sex.  Sore throat, fever, or swollen lymph nodes in the neck. This may occur if the infection was spread by oral sex. How is this diagnosed? This condition is diagnosed based on:  A physical exam.  A sample of discharge that is examined under a microscope to look for the bacteria. The discharge may be taken from the urethra, cervix, throat, or rectum.  Urine tests. Not all of test results will be available during your visit. How is this treated? This condition is treated with antibiotic medicines. It is important for treatment to begin as soon as possible. Early treatment may prevent some problems from developing. Do not have sex during treatment. Avoid all types of sexual activity for 7 days after treatment is complete and until any sex partners have been treated. Follow these instructions at home:  Take over-the-counter and prescription medicines only as told by your health care provider.  Take your antibiotic medicine as told by your health care provider. Do not stop taking the antibiotic even if you start to feel better.  Do not have sex until at least 7 days after you and your partner(s) have finished treatment and your health care provider says it is okay.    It is your responsibility to get your test results. Ask your health care provider, or the department performing the test, when your results will be ready.  If you test positive for gonorrhea, inform your recent sexual partners. This includes any oral, anal, or vaginal sex partners. They need to be checked for gonorrhea even if they do not have symptoms. They may need treatment, even if they test negative for gonorrhea.  Keep all follow-up visits as told by your health care provider.  This is important. How is this prevented?   Use latex condoms correctly every time you have sexual intercourse.  Ask if your sexual partner has been tested for STDs and had negative results.  Avoid having multiple sexual partners. Contact a health care provider if:  You develop a bad reaction to the medicine you were prescribed. This may include: ? A rash. ? Nausea. ? Vomiting. ? Diarrhea.  Your symptoms do not get better after a few days of taking antibiotics.  Your symptoms get worse.  You develop new symptoms.  Your pain gets worse.  You have a fever.  You develop pain, itching, or discharge around the eyes. Get help right away if:  You feel dizzy or faint.  You have trouble breathing or have shortness of breath.  You develop an irregular heartbeat.  You have severe abdominal pain with or without shoulder pain.  You develop any bumps or sores (lesions) on your skin.  You develop warmth, redness, pain, or swelling around your joints, such as the knee. Summary  Gonorrhea is an STDthat can affect both men and women.  This condition is caused by bacteria called Neisseria gonorrhoeae. The infection is spread from person to person, usually through sexual contact, including oral, anal, and vaginal sex.  Symptoms vary between males and females. Generally, they include abnormal discharge and burning during urination. Women may also experience painful sex, itching around the vagina, and bleeding between menstrual periods. Men may also experience swelling of the testicles.  This condition is treated with antibiotic medicines. Do not have sex until at least 7 days after completing antibiotic treatment.  If left untreated, gonorrhea can have serious side effects and complications. This information is not intended to replace advice given to you by your health care provider. Make sure you discuss any questions you have with your health care provider. Document Released:  09/05/2000 Document Revised: 05/28/2018 Document Reviewed: 08/08/2016 Elsevier Interactive Patient Education  2019 [employer] Etonogestrel implant What is this medicine? ETONOGESTREL (et oh noe JES trel) is a contraceptive (birth control) device. It is used to prevent pregnancy. It can be used for up to 3 years. This medicine may be used for other purposes; ask your health care provider or pharmacist if you have questions. COMMON BRAND NAME(S): Implanon, Nexplanon What should I tell my health care provider before I take this medicine? They need to know if you have any of these conditions: -abnormal vaginal bleeding -blood vessel disease or blood clots -breast, cervical, endometrial, ovarian, liver, or uterine cancer -diabetes -gallbladder disease -heart disease or recent heart attack -high blood pressure -high cholesterol or triglycerides -kidney disease -liver disease -migraine headaches -seizures -stroke -tobacco smoker -an unusual or allergic reaction to etonogestrel, anesthetics or antiseptics, other medicines, foods, dyes, or preservatives -pregnant or trying to get pregnant -breast-feeding How should I use this medicine? This device is inserted just under the skin on the inner side of your upper arm by a health care professional. Talk to your pediatrician  regarding the use of this medicine in children. Special care may be needed. Overdosage: If you think you have taken too much of this medicine contact a poison control center or emergency room at once. NOTE: This medicine is only for you. Do not share this medicine with others. What if I miss a dose? This does not apply. What may interact with this medicine? Do not take this medicine with any of the following medications: -amprenavir -fosamprenavir This medicine may also interact with the following medications: -acitretin -aprepitant -armodafinil -bexarotene -bosentan -carbamazepine -certain medicines for fungal  infections like fluconazole, ketoconazole, itraconazole and voriconazole -certain medicines to treat hepatitis, HIV or AIDS -cyclosporine -felbamate -griseofulvin -lamotrigine -modafinil -oxcarbazepine -phenobarbital -phenytoin -primidone -rifabutin -rifampin -rifapentine -St. John's wort -topiramate This list may not describe all possible interactions. Give your health care provider a list of all the medicines, herbs, non-prescription drugs, or dietary supplements you use. Also tell them if you smoke, drink alcohol, or use illegal drugs. Some items may interact with your medicine. What should I watch for while using this medicine? This product does not protect you against HIV infection (AIDS) or other sexually transmitted diseases. You should be able to feel the implant by pressing your fingertips over the skin where it was inserted. Contact your doctor if you cannot feel the implant, and use a non-hormonal birth control method (such as condoms) until your doctor confirms that the implant is in place. Contact your doctor if you think that the implant may have broken or become bent while in your arm. You will receive a user card from your health care provider after the implant is inserted. The card is a record of the location of the implant in your upper arm and when it should be removed. Keep this card with your health records. What side effects may I notice from receiving this medicine? Side effects that you should report to your doctor or health care professional as soon as possible: -allergic reactions like skin rash, itching or hives, swelling of the face, lips, or tongue -breast lumps, breast tissue changes, or discharge -breathing problems -changes in emotions or moods -if you feel that the implant may have broken or bent while in your arm -high blood pressure -pain, irritation, swelling, or bruising at the insertion site -scar at site of insertion -signs of infection at the  insertion site such as fever, and skin redness, pain or discharge -signs and symptoms of a blood clot such as breathing problems; changes in vision; chest pain; severe, sudden headache; pain, swelling, warmth in the leg; trouble speaking; sudden numbness or weakness of the face, arm or leg -signs and symptoms of liver injury like dark yellow or brown urine; general ill feeling or flu-like symptoms; light-colored stools; loss of appetite; nausea; right upper belly pain; unusually weak or tired; yellowing of the eyes or skin -unusual vaginal bleeding, discharge Side effects that usually do not require medical attention (report to your doctor or health care professional if they continue or are bothersome): -acne -breast pain or tenderness -headache -irregular menstrual bleeding -nausea This list may not describe all possible side effects. Call your doctor for medical advice about side effects. You may report side effects to FDA at 1-800-FDA-1088. Where should I keep my medicine? This drug is given in a hospital or clinic and will not be stored at home. NOTE: This sheet is a summary. It may not cover all possible information. If you have questions about this medicine, talk to your doctor,  pharmacist, or health care provider.  2019 Elsevier/Gold Standard (2017-07-28 14:11:42)

## 2018-09-29 NOTE — Telephone Encounter (Signed)
Called in to schedule an appointment for STD and nexplannon birth control. Offered the patient an appt. However she would like something sooner. Gave options of the after hours clinic. Stated she would like to visit our after hours.

## 2018-09-29 NOTE — Progress Notes (Signed)
GYNECOLOGY OFFICE VISIT NOTE   History:  23 y.o. [id number] here today for Nexplanon insertion and for treatment as she is a contact to gonorrhea.  Her partner has been tested and treated.  She has hot had sex since 09-15-18. She denies any bleeding, pelvic pain or other concerns. She has noticed an abnormal vaginal discharge recently.  Past Medical History:  Diagnosis Date  . Anxiety   . Headache   . Medical history non-contributory     Past Surgical History:  Procedure Laterality Date  . NO PAST SURGERIES      The following portions of the patient's history were reviewed and updated as appropriate: allergies, current medications, past family history, past medical history, past social history, past surgical history and problem list.   Pap is not due until age 53  Review of Systems:  Pertinent items noted in HPI and remainder of comprehensive ROS otherwise negative.  Objective:  Physical Exam BP 120/89   Pulse 92   Wt 174 lb (78.9 kg)   LMP 09/14/2018   BMI 30.82 kg/m  CONSTITUTIONAL: Well-developed, well-nourished female in no acute distress.  HENT:  Normocephalic, atraumatic. External right and left ear normal.  EYES: Conjunctivae and EOM are normal. Pupils are equal, round.  No scleral icterus.  NECK: Normal range of motion, supple, no masses SKIN: Skin is warm and dry. No rash noted. Not diaphoretic. No erythema. No pallor. NEUROLOGIC: Alert and oriented to person, place, and time. Normal muscle tone coordination. No cranial nerve deficit noted. PSYCHIATRIC: Normal mood and affect. Normal behavior. Normal judgment and thought content. CARDIOVASCULAR: Normal heart rate noted RESPIRATORY: Effort and breath sounds normal, no problems with respiration noted ABDOMEN: Soft, no distention noted.   PELVIC: yellow malodorous vaginal discharge noted, some erythema in vaginal walls, no CMT, no uterine tenderness, very mild adnexal tenderness on the left side. MUSCULOSKELETAL:  Normal range of motion. No edema noted.  Labs and Imaging No results found.   GYNECOLOGY OFFICE PROCEDURE NOTE Nexplanon Insertion Patient identified, informed consent performed, consent signed.   Appropriate time out taken. Nexplanon site identified.  Area prepped in usual sterile fashon. Area for placement identified.  Three ml of 1% lidocaine was used to anesthetize the area.  Nexplanon was inserted via company guidelines with the inserter.  There was minimal blood loss. There were no complications.  Client and provider were able to identify the rod in her left inner upper arm.   A pressure bandage was applied to reduce any bruising.  The patient tolerated the procedure well and was given post procedure instructions.  Patient is planning to use condoms for conception in the next 2 weeks. OF NOTE:  3 healing bruises noted on inner arm - client had no explanataion for these but were characteristic of bruising from fingers holding the arm. Her previous noted reviewed but client did not report any social problems at this time.  Assessment & Plan:  1. Vaginal discharge - gonorrhea contact Partner diagnosed with gonorrhea - has noticed change in vaginal discharge Treatment with azithromycin 1 gm PO and Rocephin 250 mg IM given in the office.  - Cervicovaginal ancillary only( [facility]) - RPR - HIV Antibody (routine testing w rflx)   Routine preventative health maintenance measures emphasized. Please refer to After Visit Summary for other counseling recommendations.   2.  Nexplanon insertion completed.  Return in about 9 months (around 06/30/2019) for annual exam.   Total face-to-face time with patient: 15 minutes.  Over  50% of encounter was spent on counseling and coordination of care.  Nolene BernheimERRI BURLESON, RN, MSN, NP-BC Nurse Practitioner, [facility], [facility] 09/29/2018 5:58 PM

## 2018-10-01 LAB — CERVICOVAGINAL ANCILLARY ONLY
Bacterial vaginitis: NEGATIVE
Candida vaginitis: NEGATIVE
Chlamydia: POSITIVE — AB
Neisseria Gonorrhea: NEGATIVE
Trichomonas: NEGATIVE

## 2018-10-03 ENCOUNTER — Encounter: Payer: Self-pay | Admitting: Nurse Practitioner

## 2018-10-03 DIAGNOSIS — A749 Chlamydial infection, unspecified: Secondary | ICD-10-CM | POA: Insufficient documentation

## 2018-10-04 ENCOUNTER — Telehealth: Payer: Self-pay | Admitting: *Deleted

## 2018-10-04 ENCOUNTER — Encounter: Payer: Self-pay | Admitting: *Deleted

## 2018-10-04 DIAGNOSIS — A749 Chlamydial infection, unspecified: Secondary | ICD-10-CM

## 2018-10-04 NOTE — Telephone Encounter (Signed)
Per Infectious Disease report noted Carle tested positive for chlamydia. I called her to notify- reached her voicemail and left a message I am calling with results from your visit and will send you a MyChart message to explain; am also sending in a rx- please call us or send [place] a MyChart message if you have questions.  Upon further review noted patient was treated at her visit on1/8/20 due to known partner exposure.  Will note in mychart note. STD form completed.

## 2018-10-27 ENCOUNTER — Ambulatory Visit: Payer: Self-pay

## 2018-11-15 ENCOUNTER — Emergency Department (HOSPITAL_BASED_OUTPATIENT_CLINIC_OR_DEPARTMENT_OTHER): Payer: Managed Care, Other (non HMO)

## 2018-11-15 ENCOUNTER — Encounter (HOSPITAL_BASED_OUTPATIENT_CLINIC_OR_DEPARTMENT_OTHER): Payer: Self-pay | Admitting: Emergency Medicine

## 2018-11-15 ENCOUNTER — Emergency Department (HOSPITAL_BASED_OUTPATIENT_CLINIC_OR_DEPARTMENT_OTHER)
Admission: EM | Admit: 2018-11-15 | Discharge: 2018-11-15 | Disposition: A | Discharge status: Home / Self Care | Payer: Managed Care, Other (non HMO) | Attending: Emergency Medicine | Admitting: Emergency Medicine

## 2018-11-15 ENCOUNTER — Other Ambulatory Visit: Payer: Self-pay

## 2018-11-15 DIAGNOSIS — R0789 Other chest pain: Secondary | ICD-10-CM | POA: Insufficient documentation

## 2018-11-15 DIAGNOSIS — M549 Dorsalgia, unspecified: Secondary | ICD-10-CM | POA: Diagnosis present

## 2018-11-15 LAB — CBC WITH DIFFERENTIAL/PLATELET
Abs Immature Granulocytes: 0.01 10*3/uL (ref 0.00–0.07)
BASOS PCT: 1 %
Basophils Absolute: 0 10*3/uL (ref 0.0–0.1)
Eosinophils Absolute: 0.2 10*3/uL (ref 0.0–0.5)
Eosinophils Relative: 3 %
HCT: 40.1 % (ref 36.0–46.0)
Hemoglobin: 12.6 g/dL (ref 12.0–15.0)
Immature Granulocytes: 0 %
Lymphocytes Relative: 37 %
Lymphs Abs: 2.7 10*3/uL (ref 0.7–4.0)
MCH: 28 pg (ref 26.0–34.0)
MCHC: 31.4 g/dL (ref 30.0–36.0)
MCV: 89.1 fL (ref 80.0–100.0)
Monocytes Absolute: 0.5 10*3/uL (ref 0.1–1.0)
Monocytes Relative: 7 %
Neutro Abs: 3.7 10*3/uL (ref 1.7–7.7)
Neutrophils Relative %: 52 %
Platelets: 300 10*3/uL (ref 150–400)
RBC: 4.5 MIL/uL (ref 3.87–5.11)
RDW: 13.3 % (ref 11.5–15.5)
WBC: 7.2 10*3/uL (ref 4.0–10.5)
nRBC: 0 % (ref 0.0–0.2)

## 2018-11-15 LAB — BASIC METABOLIC PANEL
Anion gap: 6 (ref 5–15)
BUN: 12 mg/dL (ref 6–20)
CO2: 24 mmol/L (ref 22–32)
Calcium: 8.8 mg/dL — ABNORMAL LOW (ref 8.9–10.3)
Chloride: 105 mmol/L (ref 98–111)
Creatinine, Ser: 0.74 mg/dL (ref 0.44–1.00)
GFR calc Af Amer: >60 mL/min (ref >60)
GFR calc non Af Amer: >60 mL/min (ref >60)
Glucose, Bld: 92 mg/dL (ref 70–99)
Potassium: 3.2 mmol/L — ABNORMAL LOW (ref 3.5–5.1)
Sodium: 135 mmol/L (ref 135–145)

## 2018-11-15 LAB — PREGNANCY, URINE: Preg Test, Ur: NEGATIVE

## 2018-11-15 LAB — TROPONIN I

## 2018-11-15 MED ORDER — KETOROLAC TROMETHAMINE 30 MG/ML IJ SOLN
15.0000 mg | Freq: Once | INTRAMUSCULAR | Status: AC
  Administered 2018-11-15: 15 mg via INTRAVENOUS
  Filled 2018-11-15: qty 1

## 2018-11-15 MED ORDER — IBUPROFEN 800 MG PO TABS: 800.0000 mg | ORAL_TABLET | Freq: Three times a day (TID) | ORAL | 0 refills | Status: DC

## 2018-11-15 MED ORDER — IOPAMIDOL (ISOVUE-370) INJECTION 76%
100.0000 mL | Freq: Once | INTRAVENOUS | Status: AC | PRN
  Administered 2018-11-15: 100 mL via INTRAVENOUS

## 2018-11-15 NOTE — ED Triage Notes (Signed)
Chest pain and back pain starting last night around 2300. Reports sharp pain with inspiration.

## 2018-11-15 NOTE — ED Notes (Addendum)
EMT placing leads for EKG and pt continuously texting. EMT asked pt to sit still for EKG and continues to text. Pt in NAD.

## 2018-11-15 NOTE — ED Provider Notes (Signed)
[facility] EMERGENCY DEPARTMENT Provider Note   CSN: [id number] Arrival date & time: 11/15/18  0404    History   Chief Complaint Chief Complaint  Patient presents with  . Back Pain  . Chest Pain    HPI Jamie Moore is a 23 y.o. female.     The history is provided by the patient.  Chest Pain  Pain location:  L lateral chest Pain quality: sharp   Radiates to: around the side to the back. Pain severity:  Severe Onset quality:  Gradual Duration:  6 hours Timing:  Constant Progression:  Unchanged Chronicity:  New Context: breathing and at rest   Relieved by:  Nothing Worsened by:  Nothing Ineffective treatments:  None tried Associated symptoms: no altered mental status, no anorexia, no fever, no palpitations, no PND, no shortness of breath, no syncope and no vomiting   Risk factors: no aortic disease, not pregnant and no prior DVT/PE   Started at 11 pm.  No travel nor leg pain.  Had explanon placed in January. Patient texting and face timing on the phone the entire time.   Past Medical History:  Diagnosis Date  . Anxiety   . Headache   . Medical history non-contributory     Patient Active Problem List   Diagnosis Date Noted  . Chlamydia infection 10/03/2018  . Posttraumatic stress disorder   . MDD (major depressive disorder), severe (HCC) 08/09/2018    Past Surgical History:  Procedure Laterality Date  . NO PAST SURGERIES       OB History    Gravida  2   Para  1   Term  1   Preterm      AB  1   Living  1     SAB  1   TAB      Ectopic      Multiple  0   Live Births  1            Home Medications    Prior to Admission medications   Medication Sig Start Date End Date Taking? Authorizing Provider  hydrOXYzine (ATARAX/VISTARIL) 25 MG tablet Take 1 tablet (25 mg total) by mouth 3 (three) times daily as needed for anxiety. 08/12/18  Yes Armandina Stammer I, NP  sertraline (ZOLOFT) 50 MG tablet Take 1 tablet (50 mg total) by  mouth daily. For depression 08/13/18  Yes Armandina Stammer I, NP  traZODone (DESYREL) 50 MG tablet Take 1 tablet (50 mg total) by mouth at bedtime as needed for sleep. 08/12/18  Yes Nwoko, Nicole Kindred I, NP  ibuprofen (ADVIL,MOTRIN) 600 MG tablet Take 1 tablet (600 mg total) by mouth every 6 (six) hours as needed. (May buy from over the counter): For pain Patient not taking: Reported on 09/29/2018 08/12/18   Sanjuana Kava, NP    Family History Family History  Problem Relation Age of Onset  . Hypertension Mother   . Hypertension Father   . Diabetes Maternal Grandmother   . Diabetes Paternal Grandmother     Social History Social History   Tobacco Use  . Smoking status: Never Smoker  . Smokeless tobacco: Never Used  Substance Use Topics  . Alcohol use: Yes    Frequency: Never  . Drug use: No     Allergies   Patient has no known allergies.   Review of Systems Review of Systems  Constitutional: Negative for fever.  Respiratory: Negative for shortness of breath.   Cardiovascular: Positive for chest pain.  Negative for palpitations, leg swelling, syncope and PND.  Gastrointestinal: Negative for anorexia and vomiting.  Musculoskeletal: Negative for neck pain.  All other systems reviewed and are negative.    Physical Exam Updated Vital Signs BP 124/84   Pulse 98   Temp 98.3 F (36.8 C)   Resp 16   Ht 5\' 3"  (1.6 m)   Wt 78.9 kg   LMP 10/11/2018 (Approximate)   SpO2 100%   BMI 30.82 kg/m   Physical Exam Vitals signs and nursing note reviewed.  Constitutional:      Appearance: Normal appearance. She is obese.  HENT:     Head: Normocephalic and atraumatic.     Nose: Nose normal.     Mouth/Throat:     Mouth: Mucous membranes are moist.     Pharynx: Oropharynx is clear.  Eyes:     Conjunctiva/sclera: Conjunctivae normal.     Pupils: Pupils are equal, round, and reactive to light.  Neck:     Musculoskeletal: Normal range of motion and neck supple.  Cardiovascular:      Rate and Rhythm: Normal rate and regular rhythm.     Pulses: Normal pulses.     Heart sounds: Normal heart sounds.  Pulmonary:     Effort: Pulmonary effort is normal.     Breath sounds: Normal breath sounds.  Abdominal:     General: Abdomen is flat. Bowel sounds are normal.     Tenderness: There is no abdominal tenderness.  Musculoskeletal: Normal range of motion.        General: No tenderness.     Right lower leg: No edema.     Left lower leg: No edema.  Skin:    General: Skin is warm and dry.     Capillary Refill: Capillary refill takes less than 2 seconds.  Neurological:     General: No focal deficit present.     Mental Status: She is alert and oriented to person, place, and time.  Psychiatric:        Mood and Affect: Mood normal.        Behavior: Behavior normal.      ED Treatments / Results  Labs (all labs ordered are listed, but only abnormal results are displayed) Results for orders placed or performed during the hospital encounter of 11/15/18  Pregnancy, urine  Result Value Ref Range   Preg Test, Ur NEGATIVE NEGATIVE  CBC with Differential/Platelet  Result Value Ref Range   WBC 7.2 4.0 - 10.5 K/uL   RBC 4.50 3.87 - 5.11 MIL/uL   Hemoglobin 12.6 12.0 - 15.0 g/dL   HCT 96.040.1 45.436.0 - 09.846.0 %   MCV 89.1 80.0 - 100.0 fL   MCH 28.0 26.0 - 34.0 pg   MCHC 31.4 30.0 - 36.0 g/dL   RDW 11.913.3 14.711.5 - 82.915.5 %   Platelets 300 150 - 400 K/uL   nRBC 0.0 0.0 - 0.2 %   Neutrophils Relative % 52 %   Neutro Abs 3.7 1.7 - 7.7 K/uL   Lymphocytes Relative 37 %   Lymphs Abs 2.7 0.7 - 4.0 K/uL   Monocytes Relative 7 %   Monocytes Absolute 0.5 0.1 - 1.0 K/uL   Eosinophils Relative 3 %   Eosinophils Absolute 0.2 0.0 - 0.5 K/uL   Basophils Relative 1 %   Basophils Absolute 0.0 0.0 - 0.1 K/uL   Immature Granulocytes 0 %   Abs Immature Granulocytes 0.01 0.00 - 0.07 K/uL  Basic metabolic panel  Result Value Ref  Range   Sodium 135 135 - 145 mmol/L   Potassium 3.2 (L) 3.5 - 5.1 mmol/L    Chloride 105 98 - 111 mmol/L   CO2 24 22 - 32 mmol/L   Glucose, Bld 92 70 - 99 mg/dL   BUN 12 6 - 20 mg/dL   Creatinine, Ser 9.67 0.44 - 1.00 mg/dL   Calcium 8.8 (L) 8.9 - 10.3 mg/dL   GFR calc non Af Amer >60 >60 mL/min   GFR calc Af Amer >60 >60 mL/min   Anion gap 6 5 - 15  Troponin I - ONCE - STAT  Result Value Ref Range   Troponin I <0.03 <0.03 ng/mL   No results found.  EKG EKG Interpretation  Date/Time:  Monday November 15 2018 04:16:42 EST Ventricular Rate:  100 PR Interval:    QRS Duration: 79 QT Interval:  347 QTC Calculation: 448 R Axis:   48 Text Interpretation:  Sinus tachycardia Confirmed by Caralyn Twining ([id number]) on 11/15/2018 4:19:13 AM   Radiology No results found.  Procedures Procedures (including critical care time)  Medications Ordered in ED Medications  ketorolac (TORADOL) 30 MG/ML injection 15 mg (has no administration in time range)  iopamidol (ISOVUE-370) 76 % injection 100 mL (has no administration in time range)      Final Clinical Impressions(s) / ED Diagnoses   Return for pain, intractable cough, fevers >100.4 unrelieved by medication, shortness of breath, intractable vomiting, chest pain, shortness of breath, weakness numbness, changes in speech, facial asymmetry,abdominal pain, passing out,Inability to tolerate liquids or food, cough, altered mental status or any concerns. No signs of systemic illness or infection. The patient is nontoxic-appearing on exam and vital signs are within normal limits.   I have reviewed the triage vital signs and the nursing notes. Pertinent labs &imaging results that were available during my care of the patient were reviewed by me and considered in my medical decision making (see chart for details).  After history, exam, and medical workup I feel the patient has been appropriately medically screened and is safe for discharge home. Pertinent diagnoses were discussed with the patient. Patient was given  return precautions.    Khamari Yousuf, MD 11/15/18 [phone]

## 2018-12-02 ENCOUNTER — Ambulatory Visit (INDEPENDENT_AMBULATORY_CARE_PROVIDER_SITE_OTHER): Payer: Managed Care, Other (non HMO)

## 2018-12-02 ENCOUNTER — Other Ambulatory Visit: Payer: Self-pay

## 2018-12-02 VITALS — BP 115/71 | HR 72

## 2018-12-02 DIAGNOSIS — Z202 Contact with and (suspected) exposure to infections with a predominantly sexual mode of transmission: Secondary | ICD-10-CM

## 2018-12-02 DIAGNOSIS — N898 Other specified noninflammatory disorders of vagina: Secondary | ICD-10-CM

## 2018-12-02 DIAGNOSIS — B9689 Other specified bacterial agents as the cause of diseases classified elsewhere: Secondary | ICD-10-CM | POA: Diagnosis not present

## 2018-12-02 DIAGNOSIS — Z113 Encounter for screening for infections with a predominantly sexual mode of transmission: Secondary | ICD-10-CM

## 2018-12-02 DIAGNOSIS — N760 Acute vaginitis: Secondary | ICD-10-CM

## 2018-12-02 NOTE — Progress Notes (Signed)
SUBJECTIVE:  23 y.o. female complains of  vaginal discharge for a couple of days. She reports being exposure to STI a couple days ago. Denies abnormal vaginal bleeding or significant pelvic pain or fever. No UTI symptoms. Denies history of known exposure to STD.  No LMP recorded.  OBJECTIVE:  She appears well, afebrile. Urine dipstick: not done  ASSESSMENT:  Vaginal Discharge small amount Vaginal Odor small amount   PLAN:  GC, chlamydia, trichomonas, BVAG, CVAG probe sent to lab. Treatment: To be determined once lab results are received ROV prn if symptoms persist or worsen.

## 2018-12-06 LAB — CERVICOVAGINAL ANCILLARY ONLY
Bacterial vaginitis: POSITIVE — AB
Candida vaginitis: NEGATIVE
Chlamydia: NEGATIVE
Neisseria Gonorrhea: NEGATIVE
Trichomonas: NEGATIVE

## 2018-12-06 MED ORDER — METRONIDAZOLE 0.75 % VA GEL: 1.0000 | Freq: Every day | VAGINAL | 0 refills | Status: DC

## 2018-12-06 NOTE — Addendum Note (Signed)
Addended by: Gerrit Heck L on: 12/06/2018 11:33 PM   Modules accepted: Orders

## 2018-12-07 ENCOUNTER — Encounter: Payer: Self-pay | Admitting: General Practice

## 2019-02-09 ENCOUNTER — Telehealth: Payer: Self-pay | Admitting: Obstetrics & Gynecology

## 2019-02-09 NOTE — Telephone Encounter (Signed)
Called the patient to inform of upcoming appointment. Received a fast busy signal.

## 2019-02-10 ENCOUNTER — Other Ambulatory Visit: Payer: Self-pay

## 2019-02-10 ENCOUNTER — Encounter: Payer: Self-pay | Admitting: Obstetrics and Gynecology

## 2019-02-10 ENCOUNTER — Ambulatory Visit (INDEPENDENT_AMBULATORY_CARE_PROVIDER_SITE_OTHER): Payer: Managed Care, Other (non HMO) | Admitting: Obstetrics and Gynecology

## 2019-02-10 VITALS — BP 122/83 | HR 108 | Temp 98.2°F | Wt 189.1 lb

## 2019-02-10 DIAGNOSIS — R102 Pelvic and perineal pain: Secondary | ICD-10-CM | POA: Diagnosis not present

## 2019-02-10 DIAGNOSIS — Z113 Encounter for screening for infections with a predominantly sexual mode of transmission: Secondary | ICD-10-CM | POA: Diagnosis not present

## 2019-02-10 DIAGNOSIS — Z124 Encounter for screening for malignant neoplasm of cervix: Secondary | ICD-10-CM | POA: Diagnosis not present

## 2019-02-10 LAB — POCT PREGNANCY, URINE: Preg Test, Ur: NEGATIVE

## 2019-02-10 LAB — POCT URINALYSIS DIP (DEVICE)
Bilirubin Urine: NEGATIVE
Glucose, UA: NEGATIVE mg/dL
Ketones, ur: NEGATIVE mg/dL
Nitrite: NEGATIVE
Protein, ur: NEGATIVE mg/dL
Specific Gravity, Urine: >=1.03 (ref 1.005–1.030)
Urobilinogen, UA: 0.2 mg/dL (ref 0.0–1.0)
pH: 5.5 (ref 5.0–8.0)

## 2019-02-10 NOTE — Progress Notes (Signed)
Obstetrics and Gynecology Visit Return Patient Evaluation  Appointment Date: 02/15/2019  Primary Care Provider: Patient, No Pcp Per  OBGYN Clinic: [facility]  Chief Complaint: low belly cramping  Jamie Moore is a 23 y.o. G2P1011 (LMP: none) with the above CC.  Since early April, patient with bilateral low belly cramps, feels like cyst pain she had in early pregnancy last year, is non radiating, not qday with last time this morning, no aggravating or alleviating symptoms. No LUTs s/s, pt amenorrheic on nexplanon.  No dyspareunia  ROS: as noted in the History of Present Illness.  Medications:  Chanetta C. Giegerich had no medications administered during this visit. Current Outpatient Medications  Medication Sig Dispense Refill  . sertraline (ZOLOFT) 50 MG tablet Take 1 tablet (50 mg total) by mouth daily. For depression (Patient not taking: Reported on 02/10/2019) 30 tablet 0  . traZODone (DESYREL) 50 MG tablet Take 1 tablet (50 mg total) by mouth at bedtime as needed for sleep. (Patient not taking: Reported on 12/02/2018) 30 tablet 0  Yeast infection cream  Allergies: has No Known Allergies.  Physical Exam:  BP 122/83   Pulse (!) 108   Temp 98.2 F (36.8 C)   Wt 189 lb 1.6 oz (85.8 kg)   BMI 33.50 kg/m  Body mass index is 33.5 kg/m. General appearance: Well nourished, well developed female in no acute distress.  Abdomen: diffusely non tender to palpation, non distended, and no masses, hernias Neuro/Psych:  Normal mood and affect.    Pelvic exam:  EGBUS, vaginal vault and cervix: within normal limits with white vaginal cream in vault   Assessment: pt stable  Plan:  1. Pelvic pain ucx and pap with sti testing. Offered exp management and u/s in a month or so if s/s persist or one now and pt elects for now. - Urine Culture - US PELVIC COMPLETE WITH TRANSVAGINAL; Future - Urinalysis, Routine w reflex microscopic - Cytology - PAP( [facility])   RTC: f/u after u/s   Cornelia Copa MD Attending Center for [facility])

## 2019-02-11 LAB — CYTOLOGY - PAP
Chlamydia: NEGATIVE
Neisseria Gonorrhea: NEGATIVE

## 2019-02-11 LAB — MICROSCOPIC EXAMINATION
Casts: NONE SEEN /lpf
Epithelial Cells (non renal): >10 /hpf — AB (ref 0–10)

## 2019-02-11 LAB — URINALYSIS, ROUTINE W REFLEX MICROSCOPIC
Bilirubin, UA: NEGATIVE
Glucose, UA: NEGATIVE
Ketones, UA: NEGATIVE
Nitrite, UA: NEGATIVE
Protein,UA: NEGATIVE
RBC, UA: NEGATIVE
Specific Gravity, UA: 1.026 (ref 1.005–1.030)
Urobilinogen, Ur: 0.2 mg/dL (ref 0.2–1.0)
pH, UA: 5 (ref 5.0–7.5)

## 2019-02-12 LAB — URINE CULTURE

## 2019-02-16 ENCOUNTER — Ambulatory Visit (HOSPITAL_COMMUNITY)
Admission: RE | Admit: 2019-02-16 | Discharge: 2019-02-16 | Disposition: A | Discharge status: Home / Self Care | Payer: Managed Care, Other (non HMO) | Source: Ambulatory Visit | Attending: Obstetrics and Gynecology | Admitting: Obstetrics and Gynecology

## 2019-02-16 ENCOUNTER — Other Ambulatory Visit: Payer: Self-pay

## 2019-02-16 DIAGNOSIS — R102 Pelvic and perineal pain unspecified side: Secondary | ICD-10-CM

## 2019-05-09 DIAGNOSIS — N390 Urinary tract infection, site not specified: Secondary | ICD-10-CM | POA: Diagnosis not present

## 2019-05-09 DIAGNOSIS — R102 Pelvic and perineal pain: Secondary | ICD-10-CM | POA: Diagnosis not present

## 2019-07-20 ENCOUNTER — Other Ambulatory Visit (HOSPITAL_COMMUNITY)
Admission: RE | Admit: 2019-07-20 | Discharge: 2019-07-20 | Disposition: A | Discharge status: Home / Self Care | Payer: Managed Care, Other (non HMO) | Source: Ambulatory Visit | Attending: Obstetrics and Gynecology | Admitting: Obstetrics and Gynecology

## 2019-07-20 ENCOUNTER — Encounter: Payer: Self-pay | Admitting: Obstetrics and Gynecology

## 2019-07-20 ENCOUNTER — Ambulatory Visit (INDEPENDENT_AMBULATORY_CARE_PROVIDER_SITE_OTHER): Payer: Managed Care, Other (non HMO) | Admitting: Obstetrics and Gynecology

## 2019-07-20 ENCOUNTER — Other Ambulatory Visit: Payer: Self-pay

## 2019-07-20 VITALS — BP 129/84 | HR 84 | Wt 199.4 lb

## 2019-07-20 DIAGNOSIS — Z202 Contact with and (suspected) exposure to infections with a predominantly sexual mode of transmission: Secondary | ICD-10-CM

## 2019-07-20 DIAGNOSIS — Z3046 Encounter for surveillance of implantable subdermal contraceptive: Secondary | ICD-10-CM | POA: Diagnosis not present

## 2019-07-20 MED ORDER — NORGESTIMATE-ETH ESTRADIOL 0.25-35 MG-MCG PO TABS: 1.0000 | ORAL_TABLET | Freq: Every day | ORAL | 3 refills | Status: DC

## 2019-07-20 NOTE — Procedures (Signed)
Nexplanon Removal Procedure Note 07/20/2019  Patient had it placed earlier this year and wants it out due to arm discomfort. No periods with nexplanon.  Prior to the procedure being performed, the patient (or guardian) was asked to state their full name, date of birth, type of procedure being performed and the exact location of the operative site. This information was then checked against the documentation in the patient's chart. Prior to the procedure being performed, a "time out" was performed by the physician that confirmed the correct patient, procedure and site.  After informed consent was obtained, the patient's left arm was examined and the Nexplanon rod was noted to be easily palpable. The area  was cleaned with alcohol then local anesthesia was infiltrated with 3 ml of 1% lidocaine with epi. The area was prepped with betadine. Using sterile technique, the Nexplanon device was brought to the incision site. The capsule was scrapped off with the scalpel, the Nexplanon grasped with hemostats, and easily removed; the removal site was hemostatic. The Nexplanon was inspected and noted to be intact.  A steri-strip and a pressure dressing was applied.  The patient tolerated the procedure well.  She chose to do sprintec again for birth control, which she used in the past w/o issue. Last sex two weeks ago. Pt told to wait one week before considering OCPs effective.   Durene Romans MD Attending Center for [employer] [facility])

## 2019-07-21 LAB — HEPATITIS B SURFACE ANTIGEN: Hepatitis B Surface Ag: NEGATIVE

## 2019-07-21 LAB — HEPATITIS C ANTIBODY: Hep C Virus Ab: 0.1 s/co ratio (ref 0.0–0.9)

## 2019-07-21 LAB — HIV ANTIBODY (ROUTINE TESTING W REFLEX): HIV Screen 4th Generation wRfx: NONREACTIVE

## 2019-07-21 LAB — RPR: RPR Ser Ql: NONREACTIVE

## 2019-07-25 ENCOUNTER — Other Ambulatory Visit: Payer: Self-pay | Admitting: Obstetrics and Gynecology

## 2019-07-25 LAB — CERVICOVAGINAL ANCILLARY ONLY
Bacterial Vaginitis (gardnerella): POSITIVE — AB
Candida Glabrata: NEGATIVE
Candida Vaginitis: NEGATIVE
Chlamydia: POSITIVE — AB
Comment: NEGATIVE
Comment: NEGATIVE
Comment: NEGATIVE
Comment: NEGATIVE
Comment: NEGATIVE
Comment: NORMAL
Neisseria Gonorrhea: POSITIVE — AB
Trichomonas: NEGATIVE

## 2019-07-25 MED ORDER — AZITHROMYCIN 500 MG PO TABS: 1000.0000 mg | ORAL_TABLET | Freq: Once | ORAL | 0 refills | Status: AC

## 2019-07-25 MED ORDER — METRONIDAZOLE 500 MG PO TABS: 500.0000 mg | ORAL_TABLET | Freq: Two times a day (BID) | ORAL | 0 refills | Status: DC

## 2019-07-26 ENCOUNTER — Telehealth: Payer: Self-pay | Admitting: *Deleted

## 2019-07-26 ENCOUNTER — Encounter: Payer: Self-pay | Admitting: *Deleted

## 2019-07-26 NOTE — Telephone Encounter (Signed)
-----   Message from Aletha Halim, MD sent at 07/25/2019  8:58 PM EST ----- Can you let her know (I sent her an inbasket message) about coming in for rocephin? I sent her in azithro and flagyl. Thanks!

## 2019-07-26 NOTE — Telephone Encounter (Signed)
I called Etrulia and left a message we are calling with some results and appointment we need to schedule. Please call our office .STD form filled out for health department.  Linda,RN

## 2019-07-27 NOTE — Telephone Encounter (Signed)
I called Saperrria and informed her of + results of Gonorrhea, Chlamydia and BV. She states she already picked up RX for Zithromax and took it, and got the flagyl. I explained she will also need a shot to finish treatment of  Gonorrhea and registrar made her appointment for first available nurse visit for 08/01/19.  I also informed her that her partner should also be treated and he should contact his doctor or health department . I also informed her to take flagyl with food and no alcohol. She voices understanding.  Lundyn Coste,RN

## 2019-08-01 ENCOUNTER — Other Ambulatory Visit: Payer: Self-pay

## 2019-08-01 ENCOUNTER — Ambulatory Visit (INDEPENDENT_AMBULATORY_CARE_PROVIDER_SITE_OTHER): Payer: Managed Care, Other (non HMO)

## 2019-08-01 DIAGNOSIS — Z202 Contact with and (suspected) exposure to infections with a predominantly sexual mode of transmission: Secondary | ICD-10-CM | POA: Diagnosis not present

## 2019-08-01 DIAGNOSIS — A749 Chlamydial infection, unspecified: Secondary | ICD-10-CM

## 2019-08-01 MED ORDER — AZITHROMYCIN 250 MG PO TABS
1000.0000 mg | ORAL_TABLET | Freq: Once | ORAL | Status: AC
  Administered 2019-08-01: 1000 mg via ORAL

## 2019-08-01 MED ORDER — CEFTRIAXONE SODIUM 250 MG IJ SOLR
250.0000 mg | Freq: Once | INTRAMUSCULAR | Status: AC
  Administered 2019-08-01: 250 mg via INTRAMUSCULAR

## 2019-08-01 NOTE — Progress Notes (Signed)
Pt here today for tx for GC/CH.  Pt given Zithromax 1000 mg orally and Rocephin 250 mg IM.  Pt tolerated well.  Pt encouraged to make sure that her partner(s) are treated well.  And to abstain from intercourse at least a week after both have been treated.  Pt verbalized understanding.    Mel Almond, RN 08/01/19

## 2019-08-01 NOTE — Progress Notes (Signed)
I have reviewed chart and agree with RN plan.

## 2019-09-08 ENCOUNTER — Encounter: Payer: Self-pay | Admitting: *Deleted

## 2019-09-28 ENCOUNTER — Ambulatory Visit: Payer: Managed Care, Other (non HMO)

## 2019-11-17 ENCOUNTER — Encounter (HOSPITAL_COMMUNITY): Payer: Self-pay

## 2019-11-17 ENCOUNTER — Other Ambulatory Visit: Payer: Self-pay

## 2019-11-17 ENCOUNTER — Ambulatory Visit: Payer: Managed Care, Other (non HMO) | Admitting: Advanced Practice Midwife

## 2019-11-17 ENCOUNTER — Encounter: Payer: Self-pay | Admitting: Advanced Practice Midwife

## 2019-11-17 ENCOUNTER — Ambulatory Visit (HOSPITAL_COMMUNITY)
Admission: EM | Admit: 2019-11-17 | Discharge: 2019-11-17 | Disposition: A | Discharge status: Home / Self Care | Payer: Managed Care, Other (non HMO) | Attending: Family Medicine | Admitting: Family Medicine

## 2019-11-17 DIAGNOSIS — R102 Pelvic and perineal pain: Secondary | ICD-10-CM | POA: Diagnosis not present

## 2019-11-17 DIAGNOSIS — Z113 Encounter for screening for infections with a predominantly sexual mode of transmission: Secondary | ICD-10-CM | POA: Insufficient documentation

## 2019-11-17 DIAGNOSIS — Z202 Contact with and (suspected) exposure to infections with a predominantly sexual mode of transmission: Secondary | ICD-10-CM | POA: Insufficient documentation

## 2019-11-17 DIAGNOSIS — N898 Other specified noninflammatory disorders of vagina: Secondary | ICD-10-CM | POA: Diagnosis not present

## 2019-11-17 MED ORDER — AZITHROMYCIN 250 MG PO TABS
ORAL_TABLET | ORAL | Status: AC
  Filled 2019-11-17: qty 4

## 2019-11-17 MED ORDER — CEFTRIAXONE SODIUM 500 MG IJ SOLR
500.0000 mg | Freq: Once | INTRAMUSCULAR | Status: AC
  Administered 2019-11-17: 500 mg via INTRAMUSCULAR

## 2019-11-17 MED ORDER — CEFTRIAXONE SODIUM 500 MG IJ SOLR
INTRAMUSCULAR | Status: AC
  Filled 2019-11-17: qty 500

## 2019-11-17 NOTE — ED Triage Notes (Signed)
Pt presents for STD testing after partner tested positive for STD and pt states she has been having vaginal discharge & pelvic cramping.

## 2019-11-17 NOTE — Discharge Instructions (Signed)
Treated you for gonorrhea today in clinic. We will send your swab for testing and call you with any positive results. Please refrain from sexual activity for at least 1 week to ensure the infection is cleared

## 2019-11-18 NOTE — ED Provider Notes (Signed)
[facility]    CSN: [id number] Arrival date & time: 11/17/19  1436      History   Chief Complaint Chief Complaint  Patient presents with  . STD Testing    HPI Jamie Moore is a 24 y.o. female.   Patient is a 24 year old female presents today for STD screening.  Reporting partner tested positive for gonorrhea.  She has been having some mild vaginal discharge with intermittent pelvic cramping.  This is been a constant problem.  Would like to be treated for gonorrhea today. Patient's last menstrual period was 11/16/2019.  No fever, chills, body aches, nausea, vomiting, dysuria, hematuria or urinary frequency.  ROS per HPI      Past Medical History:  Diagnosis Date  . Anxiety   . Headache     Patient Active Problem List   Diagnosis Date Noted  . Chlamydia infection 10/03/2018  . Posttraumatic stress disorder   . MDD (major depressive disorder), severe (HCC) 08/09/2018    Past Surgical History:  Procedure Laterality Date  . NO PAST SURGERIES      OB History    Gravida  2   Para  1   Term  1   Preterm      AB  1   Living  1     SAB  1   TAB      Ectopic      Multiple  0   Live Births  1            Home Medications    Prior to Admission medications   Medication Sig Start Date End Date Taking? Authorizing Provider  norgestimate-ethinyl estradiol (ORTHO-CYCLEN) 0.25-35 MG-MCG tablet Take 1 tablet by mouth daily. 07/20/19   [place] Bing, MD  sertraline (ZOLOFT) 50 MG tablet Take 1 tablet (50 mg total) by mouth daily. For depression Patient not taking: Reported on 02/10/2019 08/13/18 11/17/19  Armandina Stammer I, NP  traZODone (DESYREL) 50 MG tablet Take 1 tablet (50 mg total) by mouth at bedtime as needed for sleep. Patient not taking: Reported on 12/02/2018 08/12/18 11/17/19  Sanjuana Kava, NP    Family History Family History  Problem Relation Age of Onset  . Hypertension Mother   . Hypertension Father   . Diabetes  Maternal Grandmother   . Diabetes Paternal Grandmother     Social History Social History   Tobacco Use  . Smoking status: Never Smoker  . Smokeless tobacco: Never Used  Substance Use Topics  . Alcohol use: Yes  . Drug use: No     Allergies   Patient has no known allergies.   Review of Systems Review of Systems   Physical Exam Triage Vital Signs ED Triage Vitals [11/17/19 1503]  Enc Vitals Group     BP 121/74     Pulse Rate 99     Resp 18     Temp 98.8 F (37.1 C)     Temp Source Oral     SpO2 98 %     Weight      Height      Head Circumference      Peak Flow      Pain Score 3     Pain Loc      Pain Edu?      Excl. in GC?    No data found.  Updated Vital Signs BP 121/74 (BP Location: Left Arm)   Pulse 99   Temp 98.8 F (37.1 C) (Oral)  Resp 18   LMP 11/16/2019   SpO2 98%   Visual Acuity Right Eye Distance:   Left Eye Distance:   Bilateral Distance:    Right Eye Near:   Left Eye Near:    Bilateral Near:     Physical Exam Vitals and nursing note reviewed.  Constitutional:      General: She is not in acute distress.    Appearance: Normal appearance. She is not ill-appearing, toxic-appearing or diaphoretic.  HENT:     Head: Normocephalic.     Nose: Nose normal.  Eyes:     Conjunctiva/sclera: Conjunctivae normal.  Pulmonary:     Effort: Pulmonary effort is normal.  Musculoskeletal:        General: Normal range of motion.     Cervical back: Normal range of motion.  Skin:    General: Skin is warm and dry.     Findings: No rash.  Neurological:     Mental Status: She is alert.  Psychiatric:        Mood and Affect: Mood normal.      UC Treatments / Results  Labs (all labs ordered are listed, but only abnormal results are displayed) Labs Reviewed  CERVICOVAGINAL ANCILLARY ONLY    EKG   Radiology No results found.  Procedures Procedures (including critical care time)  Medications Ordered in UC Medications  cefTRIAXone  (ROCEPHIN) injection 500 mg (500 mg Intramuscular Given 11/17/19 1531)    Initial Impression / Assessment and Plan / UC Course  I have reviewed the triage vital signs and the nursing notes.  Pertinent labs & imaging results that were available during my care of the patient were reviewed by me and considered in my medical decision making (see chart for details).     STD exposure-treating for gonorrhea based on exposure to gonorrhea. Treating with Rocephin here in clinic today. Swab sent for testing with labs pending. Final Clinical Impressions(s) / UC Diagnoses   Final diagnoses:  STD exposure     Discharge Instructions     Treated you for gonorrhea today in clinic. We will send your swab for testing and call you with any positive results. Please refrain from sexual activity for at least 1 week to ensure the infection is cleared    ED Prescriptions    None     PDMP not reviewed this encounter.   Orvan July, NP 11/18/19 [phone]

## 2019-11-21 ENCOUNTER — Telehealth (HOSPITAL_COMMUNITY): Payer: Self-pay | Admitting: Emergency Medicine

## 2019-11-21 LAB — CERVICOVAGINAL ANCILLARY ONLY
Bacterial vaginitis: POSITIVE — AB
Candida vaginitis: NEGATIVE
Chlamydia: NEGATIVE
Neisseria Gonorrhea: POSITIVE — AB
Trichomonas: NEGATIVE

## 2019-11-21 MED ORDER — METRONIDAZOLE 500 MG PO TABS: 500.0000 mg | ORAL_TABLET | Freq: Two times a day (BID) | ORAL | 0 refills | Status: AC

## 2019-11-21 NOTE — Telephone Encounter (Signed)
Test for gonorrhea was positive. This was treated at the urgent care visit with IM rocephin 500mg . Please refrain from sexual intercourse for 7 days after treatment to give the medicine time to work. Sexual partners need to be notified and tested/treated. Condoms may reduce risk of reinfection. Recheck or followup with PCP for further evaluation if symptoms are not improving. [facility] notified.   Bacterial vaginosis is positive. Pt needs treatment. Flagyl 500 mg BID x 7 days #14 no refills sent to patients pharmacy of choice.    Patient contacted by phone and made aware of    results. Pt verbalized understanding and had all questions answered.

## 2020-01-24 ENCOUNTER — Ambulatory Visit: Payer: Managed Care, Other (non HMO) | Admitting: Student

## 2020-02-27 ENCOUNTER — Ambulatory Visit: Payer: Managed Care, Other (non HMO) | Admitting: Nurse Practitioner

## 2020-03-22 DIAGNOSIS — Z419 Encounter for procedure for purposes other than remedying health state, unspecified: Secondary | ICD-10-CM | POA: Diagnosis not present

## 2020-03-30 ENCOUNTER — Ambulatory Visit (HOSPITAL_COMMUNITY)
Admission: EM | Admit: 2020-03-30 | Discharge: 2020-03-30 | Disposition: A | Discharge status: Home / Self Care | Payer: Managed Care, Other (non HMO) | Attending: Family Medicine | Admitting: Family Medicine

## 2020-03-30 ENCOUNTER — Other Ambulatory Visit: Payer: Self-pay

## 2020-03-30 DIAGNOSIS — Z3490 Encounter for supervision of normal pregnancy, unspecified, unspecified trimester: Secondary | ICD-10-CM | POA: Diagnosis present

## 2020-03-30 DIAGNOSIS — R1030 Lower abdominal pain, unspecified: Secondary | ICD-10-CM

## 2020-03-30 DIAGNOSIS — Z3201 Encounter for pregnancy test, result positive: Secondary | ICD-10-CM

## 2020-03-30 DIAGNOSIS — R519 Headache, unspecified: Secondary | ICD-10-CM | POA: Diagnosis present

## 2020-03-30 LAB — POCT URINALYSIS DIP (DEVICE)
Bilirubin Urine: NEGATIVE
Glucose, UA: NEGATIVE mg/dL
Hgb urine dipstick: NEGATIVE
Nitrite: NEGATIVE
Protein, ur: NEGATIVE mg/dL
Specific Gravity, Urine: 1.025 (ref 1.005–1.030)
Urobilinogen, UA: 1 mg/dL (ref 0.0–1.0)
pH: 7 (ref 5.0–8.0)

## 2020-03-30 LAB — POC URINE PREG, ED: Preg Test, Ur: POSITIVE — AB

## 2020-03-30 MED ORDER — ACETAMINOPHEN-CODEINE #3 300-30 MG PO TABS: 1.0000 | ORAL_TABLET | Freq: Four times a day (QID) | ORAL | 0 refills | Status: DC | PRN

## 2020-03-30 NOTE — ED Provider Notes (Signed)
[facility]    CSN: [id number] Arrival date & time: 03/30/20  1503      History   Chief Complaint Chief Complaint  Patient presents with  . Headache  . Abdominal Pain    HPI Jamie Moore is a 24 y.o. female.   HPI   LMP late Lower abdominal cramp pain worse with movement No bleeding or discharge No NVD  Also has a headache and mild photophobia, since yesterday Has been taking acetaminophen without improvement History of migraines in the past No trauma or infection  Heart rate is up.  She says that this happens when she has anxiety.  No chest pain or dizziness/ Past Medical History:  Diagnosis Date  . Anxiety   . Headache     Patient Active Problem List   Diagnosis Date Noted  . Chlamydia infection 10/03/2018  . Posttraumatic stress disorder   . MDD (major depressive disorder), severe (HCC) 08/09/2018    Past Surgical History:  Procedure Laterality Date  . NO PAST SURGERIES      OB History    Gravida  2   Para  1   Term  1   Preterm      AB  1   Living  1     SAB  1   TAB      Ectopic      Multiple  0   Live Births  1            Home Medications    Prior to Admission medications   Medication Sig Start Date End Date Taking? Authorizing Provider  acetaminophen-codeine (TYLENOL #3) 300-30 MG tablet Take 1-2 tablets by mouth every 6 (six) hours as needed for moderate pain. 03/30/20   Eustace Moore, MD  norgestimate-ethinyl estradiol (ORTHO-CYCLEN) 0.25-35 MG-MCG tablet Take 1 tablet by mouth daily. 07/20/19 03/30/20  [place] Bing, MD  sertraline (ZOLOFT) 50 MG tablet Take 1 tablet (50 mg total) by mouth daily. For depression Patient not taking: Reported on 02/10/2019 08/13/18 11/17/19  Armandina Stammer I, NP  traZODone (DESYREL) 50 MG tablet Take 1 tablet (50 mg total) by mouth at bedtime as needed for sleep. Patient not taking: Reported on 12/02/2018 08/12/18 11/17/19  Sanjuana Kava, NP    Family History Family  History  Problem Relation Age of Onset  . Hypertension Mother   . Hypertension Father   . Diabetes Maternal Grandmother   . Diabetes Paternal Grandmother     Social History Social History   Tobacco Use  . Smoking status: Never Smoker  . Smokeless tobacco: Never Used  Substance Use Topics  . Alcohol use: Yes  . Drug use: No     Allergies   Patient has no known allergies.   Review of Systems Review of Systems  See HPI Physical Exam Triage Vital Signs ED Triage Vitals  Enc Vitals Group     BP 03/30/20 1525 125/86     Pulse Rate 03/30/20 1525 (!) 123     Resp 03/30/20 1525 16     Temp 03/30/20 1525 98.7 F (37.1 C)     Temp Source 03/30/20 1525 Oral     SpO2 03/30/20 1525 99 %     Weight --      Height --      Head Circumference --      Peak Flow --      Pain Score 03/30/20 1527 8     Pain Loc --  Pain Edu? --      Excl. in GC? --    No data found.  Updated Vital Signs BP 122/72 (BP Location: Right Arm)   Pulse (!) 114   Temp 98.7 F (37.1 C) (Oral)   Resp 16   LMP 02/29/2020 (Exact Date)   SpO2 99%      Physical Exam Constitutional:      General: She is not in acute distress.    Appearance: Normal appearance. She is well-developed and normal weight.  HENT:     Head: Normocephalic and atraumatic.     Mouth/Throat:     Mouth: Mucous membranes are moist.     Pharynx: No posterior oropharyngeal erythema.  Eyes:     Conjunctiva/sclera: Conjunctivae normal.     Pupils: Pupils are equal, round, and reactive to light.  Cardiovascular:     Rate and Rhythm: Tachycardia present.     Heart sounds: Normal heart sounds.  Pulmonary:     Effort: Pulmonary effort is normal. No respiratory distress.     Breath sounds: Normal breath sounds.  Abdominal:     General: There is no distension.     Palpations: Abdomen is soft.     Tenderness: There is no abdominal tenderness.  Musculoskeletal:        General: Normal range of motion.     Cervical back:  Normal range of motion and neck supple.  Skin:    General: Skin is warm and dry.  Neurological:     General: No focal deficit present.     Mental Status: She is alert.     Gait: Gait normal.  Psychiatric:        Mood and Affect: Mood normal.        Behavior: Behavior normal.      UC Treatments / Results  Labs (all labs ordered are listed, but only abnormal results are displayed) Labs Reviewed  POC URINE PREG, ED - Abnormal; Notable for the following components:      Result Value   Preg Test, Ur POSITIVE (*)    All other components within normal limits  POCT URINALYSIS DIP (DEVICE) - Abnormal; Notable for the following components:   Ketones, ur TRACE (*)    Leukocytes,Ua TRACE (*)    All other components within normal limits  URINE CULTURE    EKG   Radiology No results found.  Procedures Procedures (including critical care time)  Medications Ordered in UC Medications - No data to display  Initial Impression / Assessment and Plan / UC Course  I have reviewed the triage vital signs and the nursing notes.  Pertinent labs & imaging results that were available during my care of the patient were reviewed by me and considered in my medical decision making (see chart for details).      Final Clinical Impressions(s) / UC Diagnoses   Final diagnoses:  Acute intractable headache, unspecified headache type  Pregnancy at early stage  Lower abdominal pain     Discharge Instructions     You need to drink more water Take tylenol 1000 mg for pain Take the tylenol with codeine if pain severe Rest at home today Call women's health for appointment ASAP Go to [facility] ER if you have worsening abdominal pain or headache    ED Prescriptions    Medication Sig Dispense Auth. Provider   acetaminophen-codeine (TYLENOL #3) 300-30 MG tablet Take 1-2 tablets by mouth every 6 (six) hours as needed for moderate pain. 15 tablet  Eustace Moore, MD     I have  reviewed the PDMP during this encounter.   Eustace Moore, MD 03/30/20 [phone]

## 2020-03-30 NOTE — ED Triage Notes (Signed)
Pt presents with multiple complaints. Pt states she has HA x4 days that is does not get better with tylenol or sleep. Last tylenol 7/8. Pt endorsing light sensitivity, denies vision changes. Pt points to bilateral forehead with describing pain.   Pt also c/o bilateral lower abdominal pain/cramping. Pt states she is late for her period. Pt denies n/v/d. Pt denies discharge, dysuria.

## 2020-03-30 NOTE — Discharge Instructions (Addendum)
You need to drink more water Take tylenol 1000 mg for pain Take the tylenol with codeine if pain severe Rest at home today Call women's health for appointment ASAP Go to [facility] ER if you have worsening abdominal pain or headache

## 2020-04-01 LAB — URINE CULTURE
Culture: 20000 — AB
Special Requests: NORMAL

## 2020-04-02 ENCOUNTER — Telehealth (HOSPITAL_COMMUNITY): Payer: Self-pay

## 2020-04-02 MED ORDER — AMPICILLIN 500 MG PO CAPS
500.0000 mg | ORAL_CAPSULE | Freq: Two times a day (BID) | ORAL | 0 refills | Status: AC
Start: 2020-04-02 — End: 2020-04-07

## 2020-04-11 ENCOUNTER — Telehealth: Payer: Self-pay | Admitting: Family Medicine

## 2020-04-11 NOTE — Telephone Encounter (Signed)
Attempted to contact patient to get her scheduled to start prenatal care. No answer, left voicemail for patient to give the office a call back to be scheduled.

## 2020-04-16 ENCOUNTER — Telehealth: Payer: Self-pay | Admitting: Family Medicine

## 2020-04-16 NOTE — Telephone Encounter (Signed)
Attempted to contact patient to get her scheduled to start prenatal care. No answer, left voicemail for patient to give the office a call back to be scheduled.  °

## 2020-04-22 DIAGNOSIS — Z419 Encounter for procedure for purposes other than remedying health state, unspecified: Secondary | ICD-10-CM | POA: Diagnosis not present

## 2020-05-08 ENCOUNTER — Telehealth (INDEPENDENT_AMBULATORY_CARE_PROVIDER_SITE_OTHER): Payer: Managed Care, Other (non HMO)

## 2020-05-08 ENCOUNTER — Other Ambulatory Visit: Payer: Self-pay

## 2020-05-08 ENCOUNTER — Inpatient Hospital Stay (HOSPITAL_COMMUNITY)
Admission: AD | Admit: 2020-05-08 | Discharge: 2020-05-08 | Disposition: A | Discharge status: Home / Self Care | Payer: Managed Care, Other (non HMO) | Attending: Obstetrics and Gynecology | Admitting: Obstetrics and Gynecology

## 2020-05-08 ENCOUNTER — Encounter (HOSPITAL_COMMUNITY): Payer: Self-pay | Admitting: Obstetrics and Gynecology

## 2020-05-08 DIAGNOSIS — E860 Dehydration: Secondary | ICD-10-CM

## 2020-05-08 DIAGNOSIS — Z3A11 11 weeks gestation of pregnancy: Secondary | ICD-10-CM

## 2020-05-08 DIAGNOSIS — O219 Vomiting of pregnancy, unspecified: Secondary | ICD-10-CM

## 2020-05-08 DIAGNOSIS — O211 Hyperemesis gravidarum with metabolic disturbance: Secondary | ICD-10-CM | POA: Diagnosis not present

## 2020-05-08 DIAGNOSIS — Z79899 Other long term (current) drug therapy: Secondary | ICD-10-CM | POA: Insufficient documentation

## 2020-05-08 DIAGNOSIS — O26891 Other specified pregnancy related conditions, first trimester: Secondary | ICD-10-CM | POA: Diagnosis present

## 2020-05-08 DIAGNOSIS — O3680X0 Pregnancy with inconclusive fetal viability, not applicable or unspecified: Secondary | ICD-10-CM

## 2020-05-08 DIAGNOSIS — R109 Unspecified abdominal pain: Secondary | ICD-10-CM | POA: Diagnosis not present

## 2020-05-08 DIAGNOSIS — O368390 Maternal care for abnormalities of the fetal heart rate or rhythm, unspecified trimester, not applicable or unspecified: Secondary | ICD-10-CM

## 2020-05-08 LAB — CBC WITH DIFFERENTIAL/PLATELET
Abs Immature Granulocytes: 0.02 10*3/uL (ref 0.00–0.07)
Basophils Absolute: 0 10*3/uL (ref 0.0–0.1)
Basophils Relative: 0 %
Eosinophils Absolute: 0.1 10*3/uL (ref 0.0–0.5)
Eosinophils Relative: 1 %
HCT: 38.8 % (ref 36.0–46.0)
Hemoglobin: 12.9 g/dL (ref 12.0–15.0)
Immature Granulocytes: 0 %
Lymphocytes Relative: 32 %
Lymphs Abs: 2.3 10*3/uL (ref 0.7–4.0)
MCH: 28.9 pg (ref 26.0–34.0)
MCHC: 33.2 g/dL (ref 30.0–36.0)
MCV: 87 fL (ref 80.0–100.0)
Monocytes Absolute: 0.6 10*3/uL (ref 0.1–1.0)
Monocytes Relative: 8 %
Neutro Abs: 4.3 10*3/uL (ref 1.7–7.7)
Neutrophils Relative %: 59 %
Platelets: 231 10*3/uL (ref 150–400)
RBC: 4.46 MIL/uL (ref 3.87–5.11)
RDW: 13.3 % (ref 11.5–15.5)
WBC: 7.3 10*3/uL (ref 4.0–10.5)
nRBC: 0 % (ref 0.0–0.2)

## 2020-05-08 LAB — URINALYSIS, ROUTINE W REFLEX MICROSCOPIC
Bacteria, UA: NONE SEEN
Bilirubin Urine: NEGATIVE
Glucose, UA: NEGATIVE mg/dL
Hgb urine dipstick: NEGATIVE
Ketones, ur: NEGATIVE mg/dL
Nitrite: NEGATIVE
Protein, ur: NEGATIVE mg/dL
Specific Gravity, Urine: 1.02 (ref 1.005–1.030)
pH: 6 (ref 5.0–8.0)

## 2020-05-08 LAB — COMPREHENSIVE METABOLIC PANEL
ALT: 10 U/L (ref 0–44)
AST: 15 U/L (ref 15–41)
Albumin: 3.4 g/dL — ABNORMAL LOW (ref 3.5–5.0)
Alkaline Phosphatase: 39 U/L (ref 38–126)
Anion gap: 7 (ref 5–15)
BUN: 5 mg/dL — ABNORMAL LOW (ref 6–20)
CO2: 23 mmol/L (ref 22–32)
Calcium: 8.7 mg/dL — ABNORMAL LOW (ref 8.9–10.3)
Chloride: 104 mmol/L (ref 98–111)
Creatinine, Ser: 0.56 mg/dL (ref 0.44–1.00)
GFR calc Af Amer: >60 mL/min (ref >60)
GFR calc non Af Amer: >60 mL/min (ref >60)
Glucose, Bld: 97 mg/dL (ref 70–99)
Potassium: 3.8 mmol/L (ref 3.5–5.1)
Sodium: 134 mmol/L — ABNORMAL LOW (ref 135–145)
Total Bilirubin: 0.5 mg/dL (ref 0.3–1.2)
Total Protein: 6.3 g/dL — ABNORMAL LOW (ref 6.5–8.1)

## 2020-05-08 MED ORDER — METOCLOPRAMIDE HCL 5 MG/ML IJ SOLN
10.0000 mg | Freq: Once | INTRAMUSCULAR | Status: AC
  Administered 2020-05-08: 10 mg via INTRAVENOUS
  Filled 2020-05-08: qty 2

## 2020-05-08 MED ORDER — LACTATED RINGERS IV BOLUS
1000.0000 mL | Freq: Once | INTRAVENOUS | Status: AC
  Administered 2020-05-08: 1000 mL via INTRAVENOUS

## 2020-05-08 MED ORDER — BUTALBITAL-APAP-CAFFEINE 50-325-40 MG PO TABS
1.0000 | ORAL_TABLET | Freq: Once | ORAL | Status: AC
  Administered 2020-05-08: 1 via ORAL
  Filled 2020-05-08: qty 1

## 2020-05-08 MED ORDER — IBUPROFEN 600 MG PO TABS
600.0000 mg | ORAL_TABLET | Freq: Once | ORAL | Status: AC
  Administered 2020-05-08: 600 mg via ORAL
  Filled 2020-05-08: qty 1

## 2020-05-08 MED ORDER — DEXAMETHASONE SODIUM PHOSPHATE 10 MG/ML IJ SOLN
10.0000 mg | Freq: Once | INTRAMUSCULAR | Status: AC
  Administered 2020-05-08: 10 mg via INTRAVENOUS
  Filled 2020-05-08: qty 1

## 2020-05-08 MED ORDER — ONDANSETRON 8 MG PO TBDP
8.0000 mg | ORAL_TABLET | Freq: Three times a day (TID) | ORAL | 0 refills | Status: DC | PRN
Start: 2020-05-08 — End: 2020-11-13

## 2020-05-08 MED ORDER — METOCLOPRAMIDE HCL 10 MG PO TABS
10.0000 mg | ORAL_TABLET | Freq: Three times a day (TID) | ORAL | 0 refills | Status: DC
Start: 2020-05-08 — End: 2020-11-13

## 2020-05-08 MED ORDER — DIPHENHYDRAMINE HCL 50 MG/ML IJ SOLN
12.5000 mg | Freq: Once | INTRAMUSCULAR | Status: AC
  Administered 2020-05-08: 12.5 mg via INTRAVENOUS
  Filled 2020-05-08: qty 1

## 2020-05-08 NOTE — Progress Notes (Signed)
New OB Intake  I connected with Jamie Moore on 05/08/20 at 0818 by MyChart and verified that I am speaking with the correct person using two identifiers. Nurse is located at [facility] and pt is located at home.  I discussed the limitations, risks, security and privacy concerns of performing an evaluation and management service by telephone and the availability of in person appointments. I also discussed with the patient that there may be a patient responsible charge related to this service. The patient expressed understanding and agreed to proceed.   I explained I am completing New OB Intake today. We discussed her EDD of 11/27/20 that is based on LMP of 02/21/20. Pt is G3/P1. I reviewed her allergies, medications, and OB history. Previous pregnancy complicated by IUGR. Transportation and food insecurity screenings completed; pt registered for [employer].   MyChart/Babyscripts MyChart access verified. Babyscripts not addressed.  Anatomy US Explained first scheduled [place] will be around 19 weeks. Will be scheduled at new OB appt.   Labs Per chart review last PAP completed 02/10/19; normal result.  Current issues Pt reports 10/10 HA since early in pregnancy. Reports no prior history of headaches. Pt denies HA at this time, states it happens later in the day. Reports constant 6/10 pelvic pain, does not feel like a cramp. Spotting once during early pregnancy, none at this time.   Explained to pt that pelvic pain can be an indicator of ectopic pregnancy, which can be life threatening. Instructed pt to go immediately to the MAU for further assessment as this is urgent. New OB intake stopped at this point until pt has been assessed for ectopic pregnancy. Pt given phone number for transportation and instructed to call for a ride as soon as she is able to leave home.   Marjo Bicker, RN 05/08/20

## 2020-05-08 NOTE — Progress Notes (Signed)
Pt informed that the ultrasound is considered a limited OB ultrasound and is not intended to be a complete ultrasound exam.  Patient also informed that the ultrasound is not being completed with the intent of assessing for fetal or placental anomalies or any pelvic abnormalities.  Explained that the purpose of today's ultrasound is to assess for  viability.  Patient acknowledges the purpose of the exam and the limitations of the study.    SIUP seen, FHR ~165-170  Marlowe Alt, DO OB Fellow, Faculty Practice 05/08/2020 4:16 PM

## 2020-05-08 NOTE — Progress Notes (Signed)
Chart reviewed for nurse visit. Agree with plan of care.   Marny Lowenstein, PA-C 05/08/2020 10:26 AM

## 2020-05-08 NOTE — Discharge Instructions (Signed)

## 2020-05-08 NOTE — MAU Note (Addendum)
Pt reports intermittent lower mid abdominal pain that started a few weeks ago. She describes this as a stabbing pain, rating it 8/10. Worse when she stands. Denies any bleeding.   She also reports a headache for over a month. Has taken Tylenol 3 and extra-strength Tylenol with no relief.

## 2020-05-08 NOTE — MAU Provider Note (Signed)
History     CSN: [id number]  Arrival date and time: 05/08/20 1427   First Provider Initiated Contact with Patient 05/08/20 1514      Chief Complaint  Patient presents with  . Abdominal Pain   HPI  Ms.Jamie Moore is a 24 y.o. [phone] @ [redacted]w[redacted]d female here in [place] reporting lower abdominal pain that started a few weeks ago. She denies vaginal bleeding. She does report nausea and vomiting and has not taken anything for the symptoms. The pain is described as stabbing, she currently rates her pain 8 out of 10, the pain is worse in the middle of her abdomen the pain comes and goes. The pain worsens when she is up walking or moving around. No fever.   Reports not eating anything today; and thinks this may have contributed to her migraine HA.   OB History    Gravida  3   Para  1   Term  1   Preterm      AB  1   Living  1     SAB  1   TAB      Ectopic      Multiple  0   Live Births  1           Past Medical History:  Diagnosis Date  . Anxiety   . Headache     Past Surgical History:  Procedure Laterality Date  . NO PAST SURGERIES      Family History  Problem Relation Age of Onset  . Hypertension Mother   . Hypertension Father   . Diabetes Maternal Grandmother   . Diabetes Paternal Grandmother     Social History   Tobacco Use  . Smoking status: Never Smoker  . Smokeless tobacco: Never Used  Substance Use Topics  . Alcohol use: Yes  . Drug use: No    Allergies: No Known Allergies  Medications Prior to Admission  Medication Sig Dispense Refill Last Dose  . acetaminophen (TYLENOL) 500 MG tablet Take 1,000 mg by mouth every 6 (six) hours as needed.   05/07/2020 at Unknown time  . acetaminophen-codeine (TYLENOL #3) 300-30 MG tablet Take 1-2 tablets by mouth every 6 (six) hours as needed for moderate pain. (Patient not taking: Reported on 05/08/2020) 15 tablet 0    No results found.   Review of Systems  Constitutional: Positive for appetite  change. Negative for fever.  Gastrointestinal: Positive for abdominal pain, nausea and vomiting.   Physical Exam   Blood pressure 139/89, pulse (!) 121, temperature 99 F (37.2 C), temperature source Oral, resp. rate 18, height 5\' 3"  (1.6 m), weight 94.9 kg, last menstrual period 02/21/2020, SpO2 100 %, not currently breastfeeding.  Physical Exam Constitutional:      Appearance: She is well-developed. She is not ill-appearing or toxic-appearing.  HENT:     Head: Normocephalic.  Pulmonary:     Effort: Pulmonary effort is normal.  Skin:    General: Skin is warm.  Neurological:     Mental Status: She is alert.  Psychiatric:        Mood and Affect: Mood is depressed.     MAU Course  Procedures  None  MDM  Attempted to DC patient after bedside 04/22/2020 confirmed IUP Patient reports 8/10 HA. Would like to treat prior to DC home.  Ibuprofen 600 mg and 1 fioricet given. 1 hour after administration patient reports no improvement Headache cocktail ordered; pain now 0/10 + fetal heart tones via bedside  US done by Dr. Barrie Folk. See her note for official documentation.   Assessment and Plan   A:  1. Abdominal pain in pregnancy, first trimester   2. Mild dehydration   3. Ultrasound scan done for inability to hear fetal heart tones   4. Nausea and vomiting in pregnancy     P:  Discharge home in stable condition Return to MAU if symptoms worsen Rx: Zofran and reglan

## 2020-05-10 LAB — CULTURE, OB URINE: Culture: >=100000 — AB

## 2020-05-18 ENCOUNTER — Encounter: Payer: Self-pay | Admitting: Medical

## 2020-05-18 ENCOUNTER — Encounter: Payer: Managed Care, Other (non HMO) | Admitting: Medical

## 2020-05-18 DIAGNOSIS — Z3480 Encounter for supervision of other normal pregnancy, unspecified trimester: Secondary | ICD-10-CM | POA: Insufficient documentation

## 2020-05-23 DIAGNOSIS — Z419 Encounter for procedure for purposes other than remedying health state, unspecified: Secondary | ICD-10-CM | POA: Diagnosis not present

## 2020-06-22 DIAGNOSIS — Z419 Encounter for procedure for purposes other than remedying health state, unspecified: Secondary | ICD-10-CM | POA: Diagnosis not present

## 2020-07-10 ENCOUNTER — Other Ambulatory Visit: Payer: Self-pay

## 2020-07-10 ENCOUNTER — Ambulatory Visit (INDEPENDENT_AMBULATORY_CARE_PROVIDER_SITE_OTHER): Payer: Managed Care, Other (non HMO) | Admitting: Medical

## 2020-07-10 ENCOUNTER — Other Ambulatory Visit (HOSPITAL_COMMUNITY)
Admission: RE | Admit: 2020-07-10 | Discharge: 2020-07-10 | Disposition: A | Discharge status: Home / Self Care | Payer: Managed Care, Other (non HMO) | Source: Ambulatory Visit | Attending: Medical | Admitting: Medical

## 2020-07-10 ENCOUNTER — Encounter: Payer: Self-pay | Admitting: Medical

## 2020-07-10 VITALS — BP 116/73 | HR 109 | Wt 212.4 lb

## 2020-07-10 DIAGNOSIS — Z3480 Encounter for supervision of other normal pregnancy, unspecified trimester: Secondary | ICD-10-CM | POA: Diagnosis present

## 2020-07-10 DIAGNOSIS — F322 Major depressive disorder, single episode, severe without psychotic features: Secondary | ICD-10-CM

## 2020-07-10 DIAGNOSIS — O09292 Supervision of pregnancy with other poor reproductive or obstetric history, second trimester: Secondary | ICD-10-CM | POA: Insufficient documentation

## 2020-07-10 DIAGNOSIS — O0932 Supervision of pregnancy with insufficient antenatal care, second trimester: Secondary | ICD-10-CM | POA: Insufficient documentation

## 2020-07-10 MED ORDER — PRENATAL VITAMIN 27-0.8 MG PO TABS: 1.0000 | ORAL_TABLET | Freq: Every day | ORAL | 6 refills | Status: DC

## 2020-07-10 NOTE — Progress Notes (Signed)
Pts [place] scheduled for 07/12/20 @ 8:45am

## 2020-07-10 NOTE — Patient Instructions (Addendum)
Safe Medications in Pregnancy   Acne:  Benzoyl Peroxide  Salicylic Acid   Backache/Headache:  Tylenol: 2 regular strength every 4 hours OR        2 Extra strength every 6 hours   Colds/Coughs/Allergies:  Benadryl (alcohol free) 25 mg every 6 hours as needed  Breath right strips  Claritin  Cepacol throat lozenges  Chloraseptic throat spray  Cold-Eeze- up to three times per day  Cough drops, alcohol free  Flonase (by prescription only)  Guaifenesin  Mucinex  Robitussin DM (plain only, alcohol free)  Saline nasal spray/drops  Sudafed (pseudoephedrine) & Actifed * use only after [redacted] weeks gestation and if you do not have high blood pressure  Tylenol  Vicks Vaporub  Zinc lozenges  Zyrtec   Constipation:  Colace  Ducolax suppositories  Fleet enema  Glycerin suppositories  Metamucil  Milk of magnesia  Miralax  Senokot  Smooth move tea   Diarrhea:  Kaopectate  Imodium A-D   *NO pepto Bismol   Hemorrhoids:  Anusol  Anusol HC  Preparation H  Tucks   Indigestion:  Tums  Maalox  Mylanta  Zantac  Pepcid   Insomnia:  Benadryl (alcohol free) 25mg  every 6 hours as needed  Tylenol PM  Unisom, no Gelcaps   Leg Cramps:  Tums  MagGel   Nausea/Vomiting:  Bonine  Dramamine  Emetrol  Ginger extract  Sea bands  Meclizine  Nausea medication to take during pregnancy:  Unisom (doxylamine succinate 25 mg tablets) Take one tablet daily at bedtime. If symptoms are not adequately controlled, the dose can be increased to a maximum recommended dose of two tablets daily (1/2 tablet in the morning, 1/2 tablet mid-afternoon and one at bedtime).  Vitamin B6 100mg  tablets. Take one tablet twice a day (up to 200 mg per day).   Skin Rashes:  Aveeno products  Benadryl cream or 25mg  every 6 hours as needed  Calamine Lotion  1% cortisone cream   Yeast infection:  Gyne-lotrimin 7  Monistat 7    **If taking multiple medications, please check labels to avoid  duplicating the same active ingredients  **take medication as directed on the label  ** Do not exceed 4000 mg of tylenol in 24 hours  **Do not take medications that contain aspirin or ibuprofen          Second Trimester of Pregnancy  The second trimester is from week 14 through week 27 (month 4 through 6). This is often the time in pregnancy that you feel your best. Often times, morning sickness has lessened or quit. You may have more energy, and you may get hungry more often. Your unborn baby is growing rapidly. At the end of the sixth month, he or she is about 9 inches long and weighs about 1 pounds. You will likely feel the baby move between 18 and 20 weeks of pregnancy. Follow these instructions at home: Medicines  Take over-the-counter and prescription medicines only as told by your doctor. Some medicines are safe and some medicines are not safe during pregnancy.  Take a prenatal vitamin that contains at least 600 micrograms (mcg) of folic acid.  If you have trouble pooping (constipation), take medicine that will make your stool soft (stool softener) if your doctor approves. Eating and drinking   Eat regular, healthy meals.  Avoid raw meat and uncooked cheese.  If you get low calcium from the food you eat, talk to your doctor about taking a daily calcium supplement.  Avoid foods that  are high in fat and sugars, such as fried and sweet foods.  If you feel sick to your stomach (nauseous) or throw up (vomit): ? Eat 4 or 5 small meals a day instead of 3 large meals. ? Try eating a few soda crackers. ? Drink liquids between meals instead of during meals.  To prevent constipation: ? Eat foods that are high in fiber, like fresh fruits and vegetables, whole grains, and beans. ? Drink enough fluids to keep your pee (urine) clear or pale yellow. Activity  Exercise only as told by your doctor. Stop exercising if you start to have cramps.  Do not exercise if it is too hot, too  humid, or if you are in a place of great height (high altitude).  Avoid heavy lifting.  Wear low-heeled shoes. Sit and stand up straight.  You can continue to have sex unless your doctor tells you not to. Relieving pain and discomfort  Wear a good support bra if your breasts are tender.  Take warm water baths (sitz baths) to soothe pain or discomfort caused by hemorrhoids. Use hemorrhoid cream if your doctor approves.  Rest with your legs raised if you have leg cramps or low back pain.  If you develop puffy, bulging veins (varicose veins) in your legs: ? Wear support hose or compression stockings as told by your doctor. ? Raise (elevate) your feet for 15 minutes, 3-4 times a day. ? Limit salt in your food. Prenatal care  Write down your questions. Take them to your prenatal visits.  Keep all your prenatal visits as told by your doctor. This is important. Safety  Wear your seat belt when driving.  Make a list of emergency phone numbers, including numbers for family, friends, the hospital, and police and fire departments. General instructions  Ask your doctor about the right foods to eat or for help finding a counselor, if you need these services.  Ask your doctor about local prenatal classes. Begin classes before month 6 of your pregnancy.  Do not use hot tubs, steam rooms, or saunas.  Do not douche or use tampons or scented sanitary pads.  Do not cross your legs for long periods of time.  Visit your dentist if you have not done so. Use a soft toothbrush to brush your teeth. Floss gently.  Avoid all smoking, herbs, and alcohol. Avoid drugs that are not approved by your doctor.  Do not use any products that contain nicotine or tobacco, such as cigarettes and e-cigarettes. If you need help quitting, ask your doctor.  Avoid cat litter boxes and soil used by cats. These carry germs that can cause birth defects in the baby and can cause a loss of your baby (miscarriage) or  stillbirth. Contact a doctor if:  You have mild cramps or pressure in your lower belly.  You have pain when you pee (urinate).  You have bad smelling fluid coming from your vagina.  You continue to feel sick to your stomach (nauseous), throw up (vomit), or have watery poop (diarrhea).  You have a nagging pain in your belly area.  You feel dizzy. Get help right away if:  You have a fever.  You are leaking fluid from your vagina.  You have spotting or bleeding from your vagina.  You have severe belly cramping or pain.  You lose or gain weight rapidly.  You have trouble catching your breath and have chest pain.  You notice sudden or extreme puffiness (swelling) of your face, hands,  ankles, feet, or legs.  You have not felt the baby move in over an hour.  You have severe headaches that do not go away when you take medicine.  You have trouble seeing. Summary  The second trimester is from week 14 through week 27 (months 4 through 6). This is often the time in pregnancy that you feel your best.  To take care of yourself and your unborn baby, you will need to eat healthy meals, take medicines only if your doctor tells you to do so, and do activities that are safe for you and your baby.  Call your doctor if you get sick or if you notice anything unusual about your pregnancy. Also, call your doctor if you need help with the right food to eat, or if you want to know what activities are safe for you. This information is not intended to replace advice given to you by your health care provider. Make sure you discuss any questions you have with your health care provider. Document Revised: 12/31/2018 Document Reviewed: 10/14/2016 Elsevier Patient Education  2020 [employer]  Considering [place]? Guide for patients at [facility] Why consider waterbirth? . Gentle birth for babies  . Less pain medicine used in labor  . May allow for passive descent/less  pushing  . May reduce perineal tears  . More mobility and instinctive maternal position changes  . Increased maternal relaxation  . Reduced blood pressure in labor   Is waterbirth safe? What are the risks of infection, drowning or other complications? . Infection:  Marland Kitchen. Very low risk (3.7 % for tub vs 4.8% for bed)  . 7 in 8000 waterbirths with documented infection  . Poorly cleaned equipment most common cause  . Slightly lower group B strep transmission rate  . Drowning  . Maternal:  . Very low risk  . Related to seizures or fainting  . Newborn:  Marland Kitchen. Very low risk. No evidence of increased risk of respiratory problems in multiple large studies  . Physiological protection from breathing under water  . Avoid underwater birth if there are any fetal complications  . Once baby's head is out of the water, keep it out.  . Birth complication  . Some reports of cord trauma, but risk decreased by bringing baby to surface gradually  . No evidence of increased risk of shoulder dystocia. Mothers can usually change positions faster in water than in a bed, possibly aiding the maneuvers to free the shoulder.  ? You must attend a Waterbirth class at [facility] . 3rd Wednesday of every month from 7-9pm  . Free  . Register by calling [phone] or online at [email or web address]  . Bring us the certificate from the class to your prenatal appointment  Meet with a midwife at 36 weeks to see if you can still plan a waterbirth and to sign the consent.   If you plan a waterbirth at [facility], the following purchases are optional: . Fish Net . Bathing suit top  . Long-handled mirror  .  Things that would prevent you from having a waterbirth: . Unknown or Positive COVID-19 diagnosis upon admission to hospital  . Premature, <37wks  . Previous cesarean birth  . Presence of thick meconium-stained fluid  . Multiple gestation (Twins,  triplets, etc.)  . Uncontrolled diabetes or gestational diabetes requiring medication  . Hypertension diagnosed in pregnancy or preexisting hypertension (gestational hypertension, preeclampsia, or chronic hypertension) .  Heavy vaginal bleeding  . Non-reassuring fetal heart rate  . Active infection (MRSA, etc.). Group B Strep is NOT a contraindication for waterbirth.  . If your labor has to be induced and induction method requires continuous monitoring of the baby's heart rate  . Other risks/issues identified by your obstetrical provider  .  Please remember that birth is unpredictable. Under certain unforeseeable circumstances your provider may advise against giving birth in the tub. These decisions will be made on a case-by-case basis and with the safety of you and your baby as our highest priority.  **Please remember that in order to have a waterbirth, you must test Negative to COVID-19 upon admission to the hospital.**  Things that would prevent you from having a waterbirth:  Premature, <37wks  Previous cesarean birth  Presence of thick meconium-stained fluid  Multiple gestation (Twins, triplets, etc.)  Uncontrolled diabetes or gestational diabetes requiring medication  Hypertension  Heavy vaginal bleeding  Non-reassuring fetal heart rate  Active infection (MRSA, etc.)  If your labor has to be induced and induction method requires continuous monitoring of the baby's heart rate  Other risks/issues identified by your obstetrical provider

## 2020-07-10 NOTE — Progress Notes (Signed)
   PRENATAL VISIT NOTE  Subjective:  Jamie Moore is a 24 y.o. G3P1011 at [redacted]w[redacted]d being seen today for her first prenatal visit for this pregnancy.  She is currently monitored for the following issues for this low-risk pregnancy and has MDD (major depressive disorder), severe (HCC); Posttraumatic stress disorder; Chlamydia infection; Supervision of other normal pregnancy, antepartum; Late prenatal care affecting pregnancy in second trimester; and IUGR (intrauterine growth restriction) in prior pregnancy, pregnant, second trimester on their problem list.  Patient reports no complaints.  Contractions: Not present. Vag. Bleeding: None.  Movement: Present. Denies leaking of fluid.   She is planning to breastfeed. Desires contraception, unsure type  The following portions of the patient's history were reviewed and updated as appropriate: allergies, current medications, past family history, past medical history, past social history, past surgical history and problem list.   Objective:   Vitals:   07/10/20 0924  BP: 116/73  Pulse: (!) 109  Weight: 212 lb 6.4 oz (96.3 kg)    Fetal Status: Fetal Heart Rate (bpm): 153   Movement: Present     General:  Alert, oriented and cooperative. Patient is in no acute distress.  Skin: Skin is warm and dry. No rash noted.   Cardiovascular: Normal heart rate and rhythm noted  Respiratory: Normal respiratory effort, no problems with respiration noted. Clear to auscultation.   Abdomen: Soft, gravid, appropriate for gestational age. Normal bowel sounds. Non-tender. Pain/Pressure: Present     Pelvic: Deferred  Extremities: Normal range of motion.  Edema: None  Mental Status: Normal mood and affect. Normal behavior. Normal judgment and thought content.   Assessment and Plan:  Pregnancy: G3P1011 at [redacted]w[redacted]d 1. Supervision of other normal pregnancy, antepartum - CBC/D/Plt+RPR+Rh+ABO+Rub Ab... - Culture, OB Urine - Genetic Screening - Panorama and Horizon  -  Hemoglobin A1c - [place] MFM OB COMP + 14 WK; scheduled - Cervicovaginal ancillary only( [facility]) - Prenatal Vit-Fe Fumarate-FA (PRENATAL VITAMIN) 27-0.8 MG TABS; Take 1 tablet by mouth daily.  Dispense: 30 tablet; Refill: 6 - Normal pap 2020  2. MDD (major depressive disorder), severe (HCC) - No current management  - Ambulatory referral to Integrated Behavioral Health  3. Late prenatal care affecting pregnancy in second trimester - First visit at 20 weeks   4. IUGR (intrauterine growth restriction) in prior pregnancy, pregnant, second trimester - IOL in 39th week for IUGR with last pregnancy per patient. Birth weight @ 39.5 weeks was 5 lbs 13 oz  Preterm labor/second trimester warning symptoms and general obstetric precautions including but not limited to vaginal bleeding, contractions, leaking of fluid and fetal movement were reviewed in detail with the patient. Please refer to After Visit Summary for other counseling recommendations.   Discussed the normal visit cadence for prenatal care Discussed the nature of our practice with multiple providers including residents and students   Return in about 4 weeks (around 08/07/2020) for LOB, In-Person, Midwife preferred.  Future Appointments  Date Time Provider Department Center  07/12/2020  8:45 AM [facility] US5 [facility]  07/20/2020 10:15 AM [facility] HEALTH CLINICIAN [facility]  08/08/2020 10:35 AM Crisoforo Oxford, Charlesetta Garibaldi, CNM [facility]    Vonzella Nipple, PA-C

## 2020-07-11 LAB — CBC/D/PLT+RPR+RH+ABO+RUB AB...
Antibody Screen: NEGATIVE
Basophils Absolute: 0 10*3/uL (ref 0.0–0.2)
Basos: 0 %
EOS (ABSOLUTE): 0.2 10*3/uL (ref 0.0–0.4)
Eos: 2 %
HCV Ab: <0.1 s/co ratio (ref 0.0–0.9)
HIV Screen 4th Generation wRfx: NONREACTIVE
Hematocrit: 38 % (ref 34.0–46.6)
Hemoglobin: 12.7 g/dL (ref 11.1–15.9)
Hepatitis B Surface Ag: NEGATIVE
Immature Grans (Abs): 0 10*3/uL (ref 0.0–0.1)
Immature Granulocytes: 0 %
Lymphocytes Absolute: 1.8 10*3/uL (ref 0.7–3.1)
Lymphs: 25 %
MCH: 29.7 pg (ref 26.6–33.0)
MCHC: 33.4 g/dL (ref 31.5–35.7)
MCV: 89 fL (ref 79–97)
Monocytes Absolute: 0.5 10*3/uL (ref 0.1–0.9)
Monocytes: 6 %
Neutrophils Absolute: 4.8 10*3/uL (ref 1.4–7.0)
Neutrophils: 67 %
Platelets: 227 10*3/uL (ref 150–450)
RBC: 4.28 x10E6/uL (ref 3.77–5.28)
RDW: 13.1 % (ref 11.7–15.4)
RPR Ser Ql: NONREACTIVE
Rh Factor: POSITIVE
Rubella Antibodies, IGG: 4.87 index (ref >0.99)
WBC: 7.2 10*3/uL (ref 3.4–10.8)

## 2020-07-11 LAB — CERVICOVAGINAL ANCILLARY ONLY
Chlamydia: NEGATIVE
Comment: NEGATIVE
Comment: NORMAL
Neisseria Gonorrhea: NEGATIVE

## 2020-07-11 LAB — HEMOGLOBIN A1C
Est. average glucose Bld gHb Est-mCnc: 100 mg/dL
Hgb A1c MFr Bld: 5.1 % (ref 4.8–5.6)

## 2020-07-11 LAB — HCV INTERPRETATION

## 2020-07-11 NOTE — BH Specialist Note (Deleted)
Integrated Behavioral Health via Telemedicine Video (Caregility) Visit  07/11/2020 Jamie Moore [id number]  Number of Integrated Behavioral Health visits: 1 Session Start time: 10:15***  Session End time: 11:15*** Total time: {IBH Total Time:21014050} minutes  Referring Provider: Luna Kitchens, CNM Type of Service: Individual, Family, *** Patient/Family location: Home [facility] Provider location: [facility]  All persons participating in visit: Patient *** and [facility] Anecia Nusbaum Gillett ***    I connected with [name] and/or Christyna C Berry's {family members:20773} by a video enabled telemedicine application (Caregility) and verified that I am speaking with the correct person using two identifiers.   Discussed confidentiality: {YES/NO:21197}  Confirmed demographics & insurance:  {YES/NO:21197}  I discussed that engaging in this virtual visit, they consent to the provision of behavioral healthcare and the services will be billed under their insurance.   Patient and/or legal guardian expressed understanding and consented to virtual visit: {YES/NO:21197}  PRESENTING CONCERNS: Patient and/or family reports the following symptoms/concerns: *** Duration of problem: ***; Severity of problem: {Mild/Moderate/Severe:20260}  STRENGTHS (Protective Factors/Coping Skills): {CHL AMB BH PROTECTIVE FACTORS/STRENGTHS:[phone]}  ASSESSMENT: Patient currently experiencing ***.    GOALS ADDRESSED: Patient will: 1.  Reduce symptoms of: {IBH Symptoms:21014056}  2.  Increase knowledge and/or ability of: {IBH Patient Tools:21014057}  3.  Demonstrate ability to: {IBH Goals:21014053}   Progress of Goals: {CHL AMB BH PROGRESS TOWARDS TGYBW:3893734287}  INTERVENTIONS: Interventions utilized:  {IBH Interventions:21014054} Standardized Assessments completed & reviewed: {IBH Screening Tools:21014051}   OUTCOME: Patient Response:  ***   PLAN: 1. Follow up with behavioral health clinician on : *** 2. Behavioral recommendations: *** 3. Referral(s): {IBH Referrals:21014055}  I discussed the assessment and treatment plan with the patient and/or parent/guardian. They were provided an opportunity to ask questions and all were answered. They agreed with the plan and demonstrated an understanding of the instructions.   They were advised to call back or seek an in-person evaluation as appropriate.  I discussed that the purpose of this visit is to provide behavioral health care while limiting exposure to the novel coronavirus.  Discussed there is a possibility of technology failure and discussed alternative modes of communication if that failure occurs.  [name] Griffin Dewilde

## 2020-07-12 ENCOUNTER — Ambulatory Visit: Payer: Managed Care, Other (non HMO) | Attending: Medical

## 2020-07-12 ENCOUNTER — Other Ambulatory Visit: Payer: Self-pay

## 2020-07-12 ENCOUNTER — Other Ambulatory Visit: Payer: Self-pay | Admitting: *Deleted

## 2020-07-12 DIAGNOSIS — Z362 Encounter for other antenatal screening follow-up: Secondary | ICD-10-CM

## 2020-07-12 DIAGNOSIS — Z3480 Encounter for supervision of other normal pregnancy, unspecified trimester: Secondary | ICD-10-CM | POA: Insufficient documentation

## 2020-07-12 LAB — URINE CULTURE, OB REFLEX

## 2020-07-12 LAB — CULTURE, OB URINE

## 2020-07-17 ENCOUNTER — Encounter: Payer: Self-pay | Admitting: General Practice

## 2020-07-20 NOTE — BH Specialist Note (Addendum)
Integrated Behavioral Health via Telemedicine Video (Caregility) Visit  07/20/2020 Jamie Moore [id number]  Number of Integrated Behavioral Health visits: 1 Session Start time: 1:36  Session End time: 2:03 Total time: 27 minutes  Referring Provider: Vonzella Nipple, PA-C Type of Service: Individual Patient/Family location: Home [facility] Provider location: Center for [facility]  All persons participating in visit: Patient Jamie Moore and Jamie Moore   I connected with Jamie Moore  by a video enabled telemedicine application (Caregility) and verified that I am speaking with the correct person using two identifiers.   Discussed confidentiality: Yes   Confirmed demographics & insurance:  Yes   I discussed that engaging in this virtual visit, they consent to the provision of behavioral healthcare and the services will be billed under their insurance.    Patient and/or legal guardian expressed understanding and consented to virtual visit: Yes   PRESENTING CONCERNS: Patient and/or family reports the following symptoms/concerns: Pt states her primary goal is to prevent depression postpartum, as she experienced after last pregnancy; pt's primary symptoms current are "a little more irritated" and difficulty falling asleep after recent move (and 2yo daughter sleep schedule change).   Duration of problem: Worry this pregnancy; Severity of problem: mild  STRENGTHS (Protective Factors/Coping Skills): Social connections and Concrete supports in place (healthy food, safe environments, etc.)  ASSESSMENT: Patient currently experiencing History of depression postpartum.    GOALS ADDRESSED: Patient will: 1.  Maintain reduction of symptoms of: depression  2.  Increase knowledge and/or ability of: healthy habits  3.  Demonstrate ability to: Increase healthy adjustment to current life circumstances   Progress of  Goals: Ongoing  INTERVENTIONS: Interventions utilized:  Solution-Focused Strategies, Psychoeducation and/or Health Education and Link to [employer] Assessments completed & reviewed: PHQ9/GAD7 given in past two weeks   OUTCOME: Patient Response: Pt agrees to treatment plan   PLAN: 1. Follow up with behavioral health clinician on : As needed, call [facility] Jamie Moore at [phone] or front desk at [phone] to schedule follow up visit, if symptoms increase 2. Behavioral recommendations:  -Continue taking prenatal vitamin daily -Prioritize healthy sleep nightly (including resuming daughter's previous sleep schedule, so mom can sleep) -Consider reading through Postpartum Planner (on AVS) to begin planning for postpartum time -Consider apps on AVS for additional self-care and distraction as needed 3. Referral(s): Integrated [employer] (In Clinic)  I discussed the assessment and treatment plan with the patient and/or parent/guardian. They were provided an opportunity to ask questions and all were answered. They agreed with the plan and demonstrated an understanding of the instructions.   They were advised to call back or seek an in-person evaluation as appropriate.  I discussed that the purpose of this visit is to provide behavioral health care while limiting exposure to the novel coronavirus.  Discussed there is a possibility of technology failure and discussed alternative modes of communication if that failure occurs.  Jamie Moore  Depression screen [facility] 2/9 07/10/2020 05/19/2018  Decreased Interest 0 0  Down, Depressed, Hopeless 0 0  PHQ - 2 Score 0 0  Altered sleeping 0 2  Tired, decreased energy 0 0  Change in appetite 0 0  Feeling bad or failure about yourself  0 0  Trouble concentrating 0 0  Moving slowly or fidgety/restless 0 0  Suicidal thoughts 0 0  PHQ-9 Score 0 2  Some encounter information is confidential and restricted. Go to Review  Flowsheets activity to  see all data.   GAD 7 : Generalized Anxiety Score 07/10/2020 05/19/2018  Nervous, Anxious, on Edge 0 1  Control/stop worrying 0 0  Worry too much - different things 0 0  Trouble relaxing 0 0  Restless 0 0  Easily annoyed or irritable 0 0  Afraid - awful might happen 0 0  Total GAD 7 Score 0 1  Some encounter information is confidential and restricted. Go to Review Flowsheets activity to see all data.

## 2020-07-23 DIAGNOSIS — Z419 Encounter for procedure for purposes other than remedying health state, unspecified: Secondary | ICD-10-CM | POA: Diagnosis not present

## 2020-07-25 ENCOUNTER — Encounter: Payer: Self-pay | Admitting: *Deleted

## 2020-08-01 ENCOUNTER — Ambulatory Visit (INDEPENDENT_AMBULATORY_CARE_PROVIDER_SITE_OTHER): Payer: 59 | Admitting: Clinical

## 2020-08-01 DIAGNOSIS — Z8659 Personal history of other mental and behavioral disorders: Secondary | ICD-10-CM | POA: Diagnosis not present

## 2020-08-01 DIAGNOSIS — Z8759 Personal history of other complications of pregnancy, childbirth and the puerperium: Secondary | ICD-10-CM | POA: Diagnosis not present

## 2020-08-01 NOTE — Patient Instructions (Signed)
[facility] [address] [place],  [address] [phone] (main office) [phone] (Glendora Clouatre's office)     BRAINSTORMING  Develop a Plan Goals: . Provide a way to start conversation about your new life with a baby . Assist parents in recognizing and using resources within their reach . Help pave the way before birth for an easier period of transition afterwards.  Make a list of the following information to keep in a central location: . Full name of Mom and Partner: _____________________________________________ . Baby's full name and Date of Birth: ___________________________________________ . Home Address: ___________________________________________________________ ________________________________________________________________________ . Home Phone: ____________________________________________________________ . Parents' cell numbers: _____________________________________________________ ________________________________________________________________________ . Name and contact info for OB: ______________________________________________ . Name and contact info for Pediatrician:________________________________________ . Contact info for Lactation Consultants: ________________________________________  REST and SLEEP *You each need at least 4-5 hours of uninterrupted sleep every day. Write specific names and contact information.* . How are you going to rest in the postpartum period? While partner's home? When partner returns to work? When you both return to work? . Where will your baby sleep? . Who is available to help during the day? Evening? Night? . Who could move in for a period to help support you? . What are some ideas to help you get enough  sleep? __________________________________________________________________________________________________________________________________________________________________________________________________________________________________________ NUTRITIOUS FOOD AND DRINK *Plan for meals before your baby is born so you can have healthy food to eat during the immediate postpartum period.* . Who will look after breakfast? Lunch? Dinner? List names and contact information. Brainstorm quick, healthy ideas for each meal. . What can you do before baby is born to prepare meals for the postpartum period? . How can others help you with meals? . Which grocery stores provide online shopping and delivery? . Which restaurants offer take-out or delivery options? ______________________________________________________________________________________________________________________________________________________________________________________________________________________________________________________________________________________________________________________________________________________________________________________________________  CARE FOR MOM *It's important that mom is cared for and pampered in the postpartum period. Remember, the most important ways new mothers need care are: sleep, nutrition, gentle exercise, and time off.* . Who can come take care of mom during this period? Make a list of people with their contact information. . List some activities that make you feel cared for, rested, and energized? Who can make sure you have opportunities to do these things? . Does mom have a space of her very own within your home that's just for her? Make a "Mama Cave" where she can be comfortable, rest, and renew herself  daily. ______________________________________________________________________________________________________________________________________________________________________________________________________________________________________________________________________________________________________________________________________________________________________________________________________    CARE FOR AND FEEDING BABY *Knowledgeable and encouraging people will offer the best support with regard to feeding your baby.* . Educate yourself and choose the best feeding option for your baby. . Make a list of people who will guide, support, and be a resource for you as your care for and feed your baby. (Friends that have breastfed or are currently breastfeeding, lactation consultants, breastfeeding support groups, etc.) . Consider a postpartum doula. (These websites can give you information: dona.org & padanc.org) . Seek out local breastfeeding resources like the breastfeeding [facility] group at [facility] or [employer]. ______________________________________________________________________________________________________________________________________________________________________________________________________________________________________________________________________________________________________________________________________________________________________________________________________  CHORES AND ERRANDS . Who can help with a thorough cleaning before baby is born? . Make a list of people who will help with housekeeping and chores, like laundry, light cleaning, dishes, bathrooms, etc. . Who can run some errands for you? . What can you do to make sure you are stocked with basic supplies before baby is born? . Who is going to do the    shopping? ______________________________________________________________________________________________________________________________________________________________________________________________________________________________________________________________________________________________________________________________________________________________________________________________________     Family Adjustment *Nurture yourselves.it helps parents be more loving and allows for better bonding with their child.* . What sorts of things do you and partner enjoy doing together? Which activities help you to connect and strengthen your relationship? Make a list of those things. Make a list of people whom you trust to care for your baby so you can have some time together as a couple. . What types of things help partner feel connected to Mom? Make a list. . What needs will partner have in order to bond with baby? . Other children? Who will care for them when you go into labor and while you are in the hospital? . Think about what the needs of your older children might be. Who can help you meet those needs? In what ways are you helping them prepare for bringing baby home? List some specific strategies you have for family adjustment. _______________________________________________________________________________________________________________________________________________________________________________________________________________________________________________________________________________________________________________________________________________  SUPPORT *Someone who can empathize with experiences normalizes your problems and makes them more bearable.* . Make a list of other friends, neighbors, and/or co-workers you know with infants (and small children, if applicable) with whom you can connect. . Make a list of local or online support groups, mom groups, etc. in which you can be  involved. ______________________________________________________________________________________________________________________________________________________________________________________________________________________________________________________________________________________________________________________________________________________________________________________________________  Childcare Plans . Investigate and plan for childcare if mom is returning to work. . Talk about mom's concerns about her transition back to work. . Talk about partner's concerns regarding this transition.  Mental Health *Your mental health is one of the highest priorities for a pregnant or postpartum mom.* . 1 in 5 women experience anxiety and/or depression from the time of conception through the first year after birth. . Postpartum Mood Disorders are the #1 complication of pregnancy and childbirth and the suffering experienced by these mothers is not necessary! These illnesses are temporary and respond well to treatment, which often includes self-care, social support, talk therapy, and medication when needed. . Women experiencing anxiety and depression often say things like: "I'm supposed to be happy.why do I feel so sad?", "Why can't I snap out of it?", "I'm having thoughts that scare me." . There is no need to be embarrassed if you are feeling these symptoms: o Overwhelmed, anxious, angry, sad, guilty, irritable, hopeless, exhausted but can't sleep o You are NOT alone. You are NOT to blame. With help, you WILL be well. . Where can I find help? Medical professionals such as your OB, midwife, gynecologist, family practitioner, primary care provider, pediatrician, or mental health providers; [facility] support groups: Feelings After Birth, Breastfeeding Support Group, Baby and Me Group, and Fit 4 Two exercise classes. . You have permission to ask for help. It will confirm your feelings, validate your  experiences, share/learn coping strategies, and gain support and encouragement as you heal. You are important! BRAINSTORM . Make a list of local resources, including resources for mom and for partner. . Identify support groups. . Identify people to call late at night - include names and contact info. . Talk with partner about perinatal mood and anxiety disorders. . Talk with your OB, midwife, and doula about baby blues and about perinatal mood and anxiety disorders. . Talk with your pediatrician about perinatal mood and anxiety disorders.   Support & Sanity Savers   What do you really need?  . Basics . In preparing for a new baby, many expectant parents spend hours shopping for baby clothes, decorating the nursery, and deciding which car seat to   buy. Yet most don't think much about what the reality of parenting a newborn will be like, and what they need to make it through that. So, here is the advice of experienced parents. We know you'll read this, and think "they're exaggerating, I don't really need that." Just trust us on these, OK? Plan for all of this, and if it turns out you don't need it, come back and teach us how you did it!  . Must-Haves (Once baby's survival needs are met, make sure you attend to your own survival needs!) . Sleep . An average newborn sleeps 16-18 hours per day, over 6-7 sleep periods, rarely more than three hours at a time. It is normal and healthy for a newborn to wake throughout the night... but really hard on parents!! . Naps. Prioritize sleep above any responsibilities like: cleaning house, visiting friends, running errands, etc.  Sleep whenever baby sleeps. If you can't nap, at least have restful times when baby eats. The more rest you get, the more patient you will be, the more emotionally stable, and better at solving problems.  . Food . You may not have realized it would be difficult to eat when you have a newborn. Yet, when we talk to . countless new  parents, they say things like "it may be 2:00 pm when I realize I haven't had breakfast yet." Or "every time we sit down to dinner, baby needs to eat, and my food gets cold, so I don't bother to eat it." . Finger food. Before your baby is born, stock up with one months' worth of food that: 1) you can eat with one hand while holding a baby, 2) doesn't need to be prepped, 3) is good hot or cold, 4) doesn't spoil when left out for a few hours, and 5) you like to eat. Think about: nuts, dried fruit, Clif bars, pretzels, jerky, gogurt, baby carrots, apples, bananas, crackers, cheez-n-crackers, string cheese, hot pockets or frozen burritos to microwave, garden burgers and breakfast pastries to put in the toaster, yogurt drinks, etc. . Restaurant Menus. Make lists of your favorite restaurants & menu items. When family/friends want to help, you can give specific information without much thought. They can either bring you the food or send gift cards for just the right meals. . Freezer Meals.  Take some time to make a few meals to put in the freezer ahead of time.  Easy to freeze meals can be anything such as soup, lasagna, chicken pie, or spaghetti sauce. . Set up a Meal Schedule.  Ask friends and family to sign up to bring you meals during the first few weeks of being home. (It can be passed around at baby showers!) You have no idea how helpful this will be until you are in the throes of parenting.  www.takethemameal.com is a great website to check out. . Emotional Support . Know who to call when you're stressed out. Parenting a newborn is very challenging work. There are times when it totally overwhelms your normal coping abilities. EVERY NEW PARENT NEEDS TO HAVE A PLAN FOR WHO TO CALL WHEN THEY JUST CAN'T COPE ANY MORE. (And it has to be someone other than the baby's other parent!) Before your baby is born, come up with at least one person you can call for support - write their phone number down and post it on the  refrigerator. . Anxiety & Sadness. Baby blues are normal after pregnancy; however, there are more severe types of anxiety &   sadness which can occur and should not be ignored.  They are always treatable, but you have to take the first step by reaching out for help. [facility] offers a "Mom Talk" group which meets every Tuesday from 10 am - 11 am.  This group is for new moms who need support and connection after their babies are born.  Call [phone].  . Really, Really Helpful (Plan for them! Make sure these happen often!!) . Physical Support with Taking Care of Yourselves . Asking friends and family. Before your baby is born, set up a schedule of people who can come and visit and help out (or ask a friend to schedule for you). Any time someone says "let me know what I can do to help," sign them up for a day. When they get there, their job is not to take care of the baby (that's your job and your joy). Their job is to take care of you!  . Postpartum doulas. If you don't have anyone you can call on for support, look into postpartum doulas:  professionals at helping parents with caring for baby, caring for themselves, getting breastfeeding started, and helping with household tasks. www.padanc.org is a helpful website for learning about doulas in our area. . Peer Support / Parent Groups . Why: One of the greatest ideas for new parents is to be around other new parents. Parent groups give you a chance to share and listen to others who are going through the same season of life, get a sense of what is normal infant development by watching several babies learn and grow, share your stories of triumph and struggles with empathetic ears, and forgive your own mistakes when you realize all parents are learning by trial and error. . Where to find: There are many places you can meet other new parents throughout our community.  [facility] offers the following classes for new moms and their little ones:  Baby  and Me (Birth to Crawling) and Breastfeeding Support Group. Go to www.conehealthybaby.com or call [phone] for more information. . Time for your Relationship . It's easy to get so caught up in meeting baby's immediate needs that it's hard to find time to connect with your partner, and meet the needs of your relationship. It's also easy to forget what "quality time with your partner" actually looks like. If you take your baby on a date, you'd be amazed how much of your couple time is spent feeding the baby, diapering the baby, admiring the baby, and talking about the baby. . Dating: Try to take time for just the two of you. Babysitter tip: Sometimes when moms are breastfeeding a newborn, they find it hard to figure out how to schedule outings around baby's unpredictable feeding schedules. Have the babysitter come for a three hour period. When she comes over, if baby has just eaten, you can leave right away, and come back in two hours. If baby hasn't fed recently, you start the date at home. Once baby gets hungry and gets a good feeding in, you can head out for the rest of your date time. . Date Nights at Home: If you can't get out, at least set aside one evening a week to prioritize your relationship: whenever baby dozes off or doesn't have any immediate needs, spend a little time focusing on each other. . Potential conflicts: The main relationship conflicts that come up for new parents are: issues related to sexuality, financial stresses, a feeling of an unfair division   of household tasks, and conflicts in parenting styles. The more you can work on these issues before baby arrives, the better!  . Fun and Frills (Don't forget these. and don't feel guilty for indulging in them!) . Everyone has something in life that is a fun little treat that they do just for themselves. It may be: reading the morning paper, or going for a daily jog, or having coffee with a friend once a week, or going to a movie on Friday  nights, or fine chocolates, or bubble baths, or curling up with a good book. . Unless you do fun things for yourself every now and then, it's hard to have the energy for fun with your baby. Whatever your "special" treats are, make sure you find a way to continue to indulge in them after your baby is born. These special moments can recharge you, and allow you to return to baby with a new joy   PERINATAL MOOD DISORDERS: MATERNAL MENTAL HEALTH FROM CONCEPTION THROUGH THE POSTPARTUM PERIOD   Emergency and Crisis Resources:  If you are an imminent risk to self or others, are experiencing intense personal distress, and/or have noticed significant changes in activities of daily living, call:  . 911 . [facility]: [phone] . Mobile Crisis: 877-626-1772 . National Suicide Hotline: 1-800-273-8255 Or visit the following crisis centers: . Local Emergency Departments . [facility]: [address], [place] [phone]. Hours: 8:30AM-5PM. Insurance Accepted: Medicaid, Medicare, and Uninsured.  . [facility]  [address], [place] Mon-Friday 8am-3pm  [phone]                                                                                    Non-Crisis Resources: To identify specific providers that are covered by your insurance, contact your insurance company or local agencies: [facility]: 1-800-256-2452 [facility]: 888-581-9988 [employer]: 1-800-939-5911 Postpartum Support International- [place] 1-800-944-4773                                                      Outpatient therapy and medication management providers:  [facility] [phone] Hours: 9AM-5PM  Insurance Accepted: AARP, Aetna, BCBS, Cigna, [employer], Humana, Medicare  [facility] [phone] Hours: 8AM-5PM  nsurance Accepted: All insurances EXCEPT AARP, Aetna, [employer], and  [employer] [facility]: [phone]             Hours: 8AM-8PM Insurance Accepted: Aetna, BCBS, Cigna, [employer], Medicaid, Medicare, Uninsured [facility]: [phone] [facility]: [phone] Hours: 8:30AM-7PM Insurance Accepted: Aetna, BCBS, Medicaid, Medicare, Tricare, United [employer] [facility]:  [phone]              Hours:9AM-5PM Insurance Accepted:  Aetna, BCBS, [facility], Medicaid, United Health Care  [facility] [phone] Hours: 9AM-5:30PM Insurance Accepted: AARP, Aetna, BCBS, Cigna, and Medicaid, Medicare, United Health Care [facility]:  [phone] Hours: 9am-5pm Insurance Accepted: BCBS; they do not accept Medicaid/Medicare   [facility]: [phone] Hours: 9am-9pm Insurance Accepted: All major insurance including Medicaid and Medicare [facility]: [phone] Hours: 9AM-5:30PM Insurance Accepted: All insurances EXCEPT Medicaid and Medicare. [facility]: [phone]                                                                       Parenting Support Groups [facility] : [phone] [facility]:  [phone] Family Support Network (support for children in the NICU and/or with special needs), [phone]                                                                   Mental Health Support Groups Mental Health Association: [phone]                                                                                     Online Resources: Postpartum Support International: http://www.postpartum.net/  800-944-4PPD 2Moms Supporting Moms:  www.momssupportingmoms.net  /Emotional Wellbeing Apps and Websites Here are a few free apps meant to help you to help yourself.  To find, try searching on the internet to see if the app is offered on Apple/Android devices. If your first choice doesn't come up on  your device, the good news is that there are many choices! Play around with different apps to see which ones are helpful to you.    Calm This is an app meant to help increase calm feelings. Includes info, strategies, and tools for tracking your feelings.      Calm Harm  This app is meant to help with self-harm. Provides many 5-minute or 15-min coping strategies for doing instead of hurting yourself.       Healthy Minds Health Minds is a problem-solving tool to help deal with emotions and cope with stress you encounter wherever you are.      MindShift This app can help people cope with anxiety. Rather than trying to avoid anxiety, you can make an important shift and face it.      MY3  MY3 features a support system, safety plan and resources with the goal of offering a tool to use in a time of need.       My Life My Voice  This mood journal offers a simple solution for tracking your thoughts, feelings and moods. Animated emoticons can help identify your mood.       Relax Melodies Designed to help with sleep, on this app you can mix sounds and meditations for relaxation.      Smiling Mind Smiling Mind is meditation made easy: it's a simple tool that helps put a smile on your mind.          Stop, Breathe & Think  A friendly, simple guide for people through meditations for mindfulness and compassion.  Stop, Breathe and Think Kids Enter your current feelings and choose a "mission" to help you cope. Offers videos for certain moods instead of just sound recordings.       Team Orange The goal of this tool is to help teens change how they think, act, and react. This app helps you focus on your own good feelings and experiences.      The [place] Box The [place] Box (VHB) contains simple tools to help patients with coping, relaxation, distraction, and positive thinking.

## 2020-08-08 ENCOUNTER — Other Ambulatory Visit: Payer: Self-pay

## 2020-08-08 ENCOUNTER — Ambulatory Visit (INDEPENDENT_AMBULATORY_CARE_PROVIDER_SITE_OTHER): Payer: Managed Care, Other (non HMO) | Admitting: Student

## 2020-08-08 VITALS — BP 110/80 | HR 97 | Wt 216.7 lb

## 2020-08-08 DIAGNOSIS — Z3A24 24 weeks gestation of pregnancy: Secondary | ICD-10-CM

## 2020-08-08 DIAGNOSIS — Z3480 Encounter for supervision of other normal pregnancy, unspecified trimester: Secondary | ICD-10-CM

## 2020-08-08 MED ORDER — COMFORT FIT MATERNITY SUPP MED MISC: 1.0000 | Freq: Once | 0 refills | Status: AC

## 2020-08-08 NOTE — Progress Notes (Signed)
   PRENATAL VISIT NOTE  Subjective:  Jamie Moore is a 24 y.o. G3P1011 at 100w1d being seen today for ongoing prenatal care.  She is currently monitored for the following issues for this low-risk pregnancy and has MDD (major depressive disorder), severe (HCC); Posttraumatic stress disorder; Chlamydia infection; Supervision of other normal pregnancy, antepartum; Late prenatal care affecting pregnancy in second trimester; and IUGR (intrauterine growth restriction) in prior pregnancy, pregnant, second trimester on their problem list.  Patient reports wanting her pregnancy support belt. She is also interested in a waterbirth. Has not done any research on WB. Marland Kitchen  Contractions: Not present. Vag. Bleeding: None.  Movement: Present. Denies leaking of fluid.   The following portions of the patient's history were reviewed and updated as appropriate: allergies, current medications, past family history, past medical history, past social history, past surgical history and problem list.   Objective:   Vitals:   08/08/20 1042  BP: 110/80  Pulse: 97  Weight: 216 lb 11.2 oz (98.3 kg)    Fetal Status: Fetal Heart Rate (bpm): 155 Fundal Height: 28 cm Movement: Present     General:  Alert, oriented and cooperative. Patient is in no acute distress.  Skin: Skin is warm and dry. No rash noted.   Cardiovascular: Normal heart rate noted  Respiratory: Normal respiratory effort, no problems with respiration noted  Abdomen: Soft, gravid, appropriate for gestational age.  Pain/Pressure: Present     Pelvic: Cervical exam deferred        Extremities: Normal range of motion.  Edema: None  Mental Status: Normal mood and affect. Normal behavior. Normal judgment and thought content.   Assessment and Plan:  Pregnancy: G3P1011 at [redacted]w[redacted]d 1. Supervision of other normal pregnancy, antepartum -information given on waterbirth; reviewed exclusions to WB -reivewed BP and when to take; reviewed what to do when BP is high  (rest, come to MAU if more than two readings 140/90)  -reviewed covid vaccination; patient is unsure at this time. -reviewed 2 hour GTT protocol for next visit  - Elastic Bandages & Supports (COMFORT FIT MATERNITY SUPP MED) MISC; 1 each by Does not apply route once for 1 dose.  Dispense: 1 each; Refill: 0  Preterm labor symptoms and general obstetric precautions including but not limited to vaginal bleeding, contractions, leaking of fluid and fetal movement were reviewed in detail with the patient. Please refer to After Visit Summary for other counseling recommendations.   Return in about 4 weeks (around 09/05/2020), or LROB with KK and 2 hour gtt.  Future Appointments  Date Time Provider Department Center  09/05/2020  8:20 AM [facility] LAB [facility]  09/05/2020  8:55 AM Marylene Land, CNM [facility]  09/06/2020 12:30 PM [facility] NURSE [facility]  09/06/2020 12:45 PM [facility] US5 [facility]    Samara Deist Gena Fray, CNM

## 2020-08-08 NOTE — Progress Notes (Signed)
Medicaid Home Form Completed-08/08/20

## 2020-08-08 NOTE — Progress Notes (Signed)
.  cwhpre

## 2020-08-08 NOTE — Patient Instructions (Addendum)
Considering Waterbirth? Guide for patients at [facility] ([facility]) Why consider waterbirth? . Gentle birth for babies  . Less pain medicine used in labor  . May allow for passive descent/less pushing  . May reduce perineal tears  . More mobility and instinctive maternal position changes  . Increased maternal relaxation   Is waterbirth safe? What are the risks of infection, drowning or other complications? . Infection:  . Very low risk (3.7 % for tub vs 4.8% for bed)  . 7 in 8000 waterbirths with documented infection  . Poorly cleaned equipment most common cause  . Slightly lower group B strep transmission rate  . Drowning  . Maternal:  . Very low risk  . Related to seizures or fainting  . Newborn:  . Very low risk. No evidence of increased risk of respiratory problems in multiple large studies  . Physiological protection from breathing under water  . Avoid underwater birth if there are any fetal complications  . Once baby's head is out of the water, keep it out.  . Birth complication  . Some reports of cord trauma, but risk decreased by bringing baby to surface gradually  . No evidence of increased risk of shoulder dystocia. Mothers can usually change positions faster in water than in a bed, possibly aiding the maneuvers to free the shoulder.   There are 2 things you MUST do to have a waterbirth with [facility]: 1. Attend a waterbirth class at [facility]   a. 3rd Wednesday of every month from 7-9 pm (virtual during COVID) b. Free c. Register by calling [phone] or register online at [email or web address] d. Bring us the certificate from the class to your prenatal appointment or send via MyChart 2. Meet with a midwife at 36 weeks* to see if you can still plan a waterbirth and to sign the consent.   *We also recommend that you schedule as many of your prenatal visits with a midwife as possible.    Helpful information: . You may  want to bring a bathing suit top to the hospital to wear during labor but this is optional.  All other supplies are provided by the hospital. . Please arrive at the hospital with signs of active labor, and do not wait at home until late in labor. It takes 45 min- 2 hours for COVID testing, fetal monitoring, and check in to your room to take place, plus transport and filling of the waterbirth tub.    Things that would prevent you from having a waterbirth: . Unknown or Positive COVID-19 diagnosis upon admission to hospital* . Premature, <37wks  . Previous cesarean birth  . Presence of thick meconium-stained fluid  . Multiple gestation (Twins, triplets, etc.)  . Uncontrolled diabetes or gestational diabetes requiring medication  . Hypertension diagnosed in pregnancy or preexisting hypertension (gestational hypertension, preeclampsia, or chronic hypertension) . Heavy vaginal bleeding  . Non-reassuring fetal heart rate  . Active infection (MRSA, etc.). Group B Strep is NOT a contraindication for waterbirth.  . If your labor has to be induced and induction method requires continuous monitoring of the baby's heart rate  . Other risks/issues identified by your obstetrical provider   Please remember that birth is unpredictable. Under certain unforeseeable circumstances your provider may advise against giving birth in the tub. These decisions will be made on a case-by-case basis and with the safety of you and your baby as our highest priority.   *Please remember that in order   to have a waterbirth, you must test Negative to COVID-19 upon admission to the hospital.  Updated 08/07/2020     PREGNANCY SUPPORT BELT: You are not alone, Seventy-five percent of women have some sort of abdominal or back pain at some point in their pregnancy. Your baby is growing at a fast pace, which means that your whole body is rapidly trying to adjust to the changes. As your uterus grows, your back may start feeling a bit  under stress and this can result in back or abdominal pain that can go from mild, and therefore bearable, to severe pains that will not allow you to sit or lay down comfortably, When it comes to dealing with pregnancy-related pains and cramps, some pregnant women usually prefer natural remedies, which the market is filled with nowadays. For example, wearing a pregnancy support belt can help ease and lessen your discomfort and pain. WHAT ARE THE BENEFITS OF WEARING A PREGNANCY SUPPORT BELT? A pregnancy support belt provides support to the lower portion of the belly taking some of the weight of the growing uterus and distributing to the other parts of your body. It is designed make you comfortable and gives you extra support. Over the years, the pregnancy apparel market has been studying the needs and wants of pregnant women and they have come up with the most comfortable pregnancy support belts that woman could ever ask for. In fact, you will no longer have to wear a stretched-out or bulky pregnancy belt that is visible underneath your clothes and makes you feel even more uncomfortable. Nowadays, a pregnancy support belt is made of comfortable and stretchy materials that will not irritate your skin but will actually make you feel at ease and you will not even notice you are wearing it. They are easy to put on and adjust during the day and can be worn at night for additional support.  BENEFITS: . Relives Back pain . Relieves Abdominal Muscle and Leg Pain . Stabilizes the Pelvic Ring . Offers a Cushioned Abdominal Lift Pad . Relieves pressure on the Sciatic Nerve Within Minutes WHERE TO GET YOUR PREGNANCY BELT: [employer] [phone] @2301  [address] [place], [place]

## 2020-08-22 DIAGNOSIS — Z419 Encounter for procedure for purposes other than remedying health state, unspecified: Secondary | ICD-10-CM | POA: Diagnosis not present

## 2020-08-30 NOTE — BH Specialist Note (Signed)
Integrated Behavioral Health via Telemedicine Visit  08/30/2020 Jamie Moore [id number]  Pt did not arrive to video visit and did not answer the phone ; Left HIPPA-compliant message to call back Jamie Moore from [facility] [employer] [facility] at [phone] (main office) or [phone] (Raudel Bazen's office).  ; left MyChart message for patient.    [name] Mansour Balboa, LCSW

## 2020-09-05 ENCOUNTER — Telehealth: Payer: Self-pay | Admitting: Lactation Services

## 2020-09-05 ENCOUNTER — Other Ambulatory Visit: Payer: Managed Care, Other (non HMO)

## 2020-09-05 ENCOUNTER — Encounter: Payer: Managed Care, Other (non HMO) | Admitting: Student

## 2020-09-05 NOTE — Telephone Encounter (Signed)
Patient called and LM that she is having leg pain for the last week. She reports she is having difficulty walking and  getting out of bed and it is very painful. She is wanting to know if she may have Sciatica.

## 2020-09-06 ENCOUNTER — Encounter: Payer: Self-pay | Admitting: *Deleted

## 2020-09-06 ENCOUNTER — Ambulatory Visit: Payer: Managed Care, Other (non HMO) | Admitting: *Deleted

## 2020-09-06 ENCOUNTER — Ambulatory Visit: Payer: Managed Care, Other (non HMO) | Attending: Obstetrics and Gynecology

## 2020-09-06 ENCOUNTER — Other Ambulatory Visit: Payer: Self-pay | Admitting: Maternal & Fetal Medicine

## 2020-09-06 ENCOUNTER — Other Ambulatory Visit: Payer: Self-pay

## 2020-09-06 DIAGNOSIS — Z362 Encounter for other antenatal screening follow-up: Secondary | ICD-10-CM

## 2020-09-06 DIAGNOSIS — O09299 Supervision of pregnancy with other poor reproductive or obstetric history, unspecified trimester: Secondary | ICD-10-CM

## 2020-09-06 DIAGNOSIS — Z3480 Encounter for supervision of other normal pregnancy, unspecified trimester: Secondary | ICD-10-CM

## 2020-09-06 DIAGNOSIS — O365930 Maternal care for other known or suspected poor fetal growth, third trimester, not applicable or unspecified: Secondary | ICD-10-CM | POA: Diagnosis not present

## 2020-09-06 DIAGNOSIS — Z3A28 28 weeks gestation of pregnancy: Secondary | ICD-10-CM | POA: Diagnosis not present

## 2020-09-06 DIAGNOSIS — O09292 Supervision of pregnancy with other poor reproductive or obstetric history, second trimester: Secondary | ICD-10-CM

## 2020-09-06 NOTE — Telephone Encounter (Signed)
Called patient stating I am returning her phone call. Patient reports sharp pains that start in her back and radiate down through her hips into her legs. Patient states sometimes she feels like she can't stand or her legs will go out. Patient reports already using maternity support belt and tylenol with minimal relief. Per chart review, patient should have OB visit next week. Told patient I would have her OB appt rescheduled for next week and advised she discuss with a provider at that time. Recommended gentle stretching and yoga for pelvic pain in between now & then. Patient verbalized understanding.

## 2020-09-06 NOTE — Progress Notes (Signed)
C/o" sharp pain in lower back that shoots down through butt to leg, right leg sometimes goes numb x 2 weeks."

## 2020-09-10 ENCOUNTER — Other Ambulatory Visit: Payer: Self-pay | Admitting: *Deleted

## 2020-09-10 ENCOUNTER — Ambulatory Visit: Payer: Managed Care, Other (non HMO)

## 2020-09-10 DIAGNOSIS — O365930 Maternal care for other known or suspected poor fetal growth, third trimester, not applicable or unspecified: Secondary | ICD-10-CM

## 2020-09-11 ENCOUNTER — Other Ambulatory Visit: Payer: Self-pay

## 2020-09-11 ENCOUNTER — Other Ambulatory Visit: Payer: Self-pay | Admitting: *Deleted

## 2020-09-11 ENCOUNTER — Ambulatory Visit: Payer: Medicaid Other | Admitting: Clinical

## 2020-09-11 DIAGNOSIS — Z91199 Patient's noncompliance with other medical treatment and regimen due to unspecified reason: Secondary | ICD-10-CM

## 2020-09-11 DIAGNOSIS — Z3480 Encounter for supervision of other normal pregnancy, unspecified trimester: Secondary | ICD-10-CM

## 2020-09-12 ENCOUNTER — Other Ambulatory Visit: Payer: Self-pay

## 2020-09-12 ENCOUNTER — Other Ambulatory Visit: Payer: Managed Care, Other (non HMO)

## 2020-09-12 DIAGNOSIS — Z3480 Encounter for supervision of other normal pregnancy, unspecified trimester: Secondary | ICD-10-CM

## 2020-09-13 LAB — HIV ANTIBODY (ROUTINE TESTING W REFLEX): HIV Screen 4th Generation wRfx: NONREACTIVE

## 2020-09-13 LAB — CBC
Hematocrit: 38.5 % (ref 34.0–46.6)
Hemoglobin: 13 g/dL (ref 11.1–15.9)
MCH: 30.3 pg (ref 26.6–33.0)
MCHC: 33.8 g/dL (ref 31.5–35.7)
MCV: 90 fL (ref 79–97)
Platelets: 204 10*3/uL (ref 150–450)
RBC: 4.29 x10E6/uL (ref 3.77–5.28)
RDW: 12.6 % (ref 11.7–15.4)
WBC: 9.7 10*3/uL (ref 3.4–10.8)

## 2020-09-13 LAB — RPR: RPR Ser Ql: NONREACTIVE

## 2020-09-17 ENCOUNTER — Other Ambulatory Visit: Payer: Self-pay | Admitting: *Deleted

## 2020-09-17 ENCOUNTER — Other Ambulatory Visit: Payer: Managed Care, Other (non HMO)

## 2020-09-17 DIAGNOSIS — Z3480 Encounter for supervision of other normal pregnancy, unspecified trimester: Secondary | ICD-10-CM

## 2020-09-19 ENCOUNTER — Ambulatory Visit: Payer: Managed Care, Other (non HMO) | Admitting: *Deleted

## 2020-09-19 ENCOUNTER — Ambulatory Visit: Payer: Managed Care, Other (non HMO)

## 2020-09-19 ENCOUNTER — Ambulatory Visit: Payer: Managed Care, Other (non HMO) | Attending: Maternal & Fetal Medicine

## 2020-09-19 ENCOUNTER — Other Ambulatory Visit: Payer: Self-pay

## 2020-09-19 ENCOUNTER — Encounter: Payer: Self-pay | Admitting: *Deleted

## 2020-09-19 DIAGNOSIS — Z3A30 30 weeks gestation of pregnancy: Secondary | ICD-10-CM

## 2020-09-19 DIAGNOSIS — O0932 Supervision of pregnancy with insufficient antenatal care, second trimester: Secondary | ICD-10-CM

## 2020-09-19 DIAGNOSIS — O365930 Maternal care for other known or suspected poor fetal growth, third trimester, not applicable or unspecified: Secondary | ICD-10-CM | POA: Insufficient documentation

## 2020-09-19 DIAGNOSIS — O09293 Supervision of pregnancy with other poor reproductive or obstetric history, third trimester: Secondary | ICD-10-CM | POA: Diagnosis not present

## 2020-09-19 DIAGNOSIS — O99212 Obesity complicating pregnancy, second trimester: Secondary | ICD-10-CM | POA: Diagnosis not present

## 2020-09-19 DIAGNOSIS — Z3480 Encounter for supervision of other normal pregnancy, unspecified trimester: Secondary | ICD-10-CM | POA: Insufficient documentation

## 2020-09-19 DIAGNOSIS — O09292 Supervision of pregnancy with other poor reproductive or obstetric history, second trimester: Secondary | ICD-10-CM | POA: Diagnosis present

## 2020-09-19 DIAGNOSIS — E669 Obesity, unspecified: Secondary | ICD-10-CM

## 2020-09-21 ENCOUNTER — Other Ambulatory Visit: Payer: Self-pay

## 2020-09-21 ENCOUNTER — Ambulatory Visit (HOSPITAL_COMMUNITY)
Admission: EM | Admit: 2020-09-21 | Discharge: 2020-09-21 | Disposition: A | Discharge status: Home / Self Care | Payer: Managed Care, Other (non HMO) | Attending: Student | Admitting: Student

## 2020-09-21 DIAGNOSIS — Z3A00 Weeks of gestation of pregnancy not specified: Secondary | ICD-10-CM | POA: Insufficient documentation

## 2020-09-21 DIAGNOSIS — J069 Acute upper respiratory infection, unspecified: Secondary | ICD-10-CM | POA: Diagnosis not present

## 2020-09-21 DIAGNOSIS — U071 COVID-19: Secondary | ICD-10-CM | POA: Insufficient documentation

## 2020-09-21 DIAGNOSIS — H60392 Other infective otitis externa, left ear: Secondary | ICD-10-CM

## 2020-09-21 DIAGNOSIS — F419 Anxiety disorder, unspecified: Secondary | ICD-10-CM | POA: Insufficient documentation

## 2020-09-21 DIAGNOSIS — J029 Acute pharyngitis, unspecified: Secondary | ICD-10-CM | POA: Diagnosis not present

## 2020-09-21 DIAGNOSIS — O99340 Other mental disorders complicating pregnancy, unspecified trimester: Secondary | ICD-10-CM | POA: Diagnosis not present

## 2020-09-21 DIAGNOSIS — O26899 Other specified pregnancy related conditions, unspecified trimester: Secondary | ICD-10-CM | POA: Insufficient documentation

## 2020-09-21 DIAGNOSIS — Z79899 Other long term (current) drug therapy: Secondary | ICD-10-CM | POA: Diagnosis not present

## 2020-09-21 LAB — RESP PANEL BY RT-PCR (FLU A&B, COVID) ARPGX2
Influenza A by PCR: NEGATIVE
Influenza B by PCR: NEGATIVE
SARS Coronavirus 2 by RT PCR: POSITIVE — AB

## 2020-09-21 MED ORDER — NEOMYCIN-POLYMYXIN-HC 3.5-10000-1 OT SUSP: 4.0000 [drp] | Freq: Three times a day (TID) | OTIC | 0 refills | Status: AC

## 2020-09-21 NOTE — Discharge Instructions (Addendum)
-  Use cortisporin drops in your left ear 3x daily for 7 days. Keep the ear dry for 7 days.  -Follow-up with your OB if you develop high fevers, chest pain, abd pain, urinary symptoms, shortness of breath, etc.

## 2020-09-21 NOTE — ED Triage Notes (Signed)
PT C/O: cold sx onset 2 days associated w/coughing, runny nose, congestion, left ear pain, sore throat, fevers  Pt is 30 weeks preg.   DENIES: v/n/d  TAKING MEDS: OTC Robitussin   A&O x4... NAD... Ambulatory

## 2020-09-21 NOTE — ED Provider Notes (Signed)
[facility]    CSN: [id number] Arrival date & time: 09/21/20  1618      History   Chief Complaint Chief Complaint  Patient presents with  . URI    HPI Jamie Moore is a 24 y.o. female Presenting for URI symptoms for 2 days. History of anxiety and headaches; currently pregnant. Endorses 2 days of cold symptoms with coughing, congestion, L ear pain both outside and inside ear, sore throat, fevers (100 at home).  Denies hearing changes, denies dizziness, denies tinnitus. Denies n/v/d, shortness of breath, chest pain, facial pain, teeth pain, headaches, sore throat, loss of taste/smell, swollen lymph nodes. She is pregnant. Denies chest pain, shortness of breath, confusion, high fevers.  Not vaccinated for covid-19.   HPI  Past Medical History:  Diagnosis Date  . Anxiety   . Headache     Patient Active Problem List   Diagnosis Date Noted  . Late prenatal care affecting pregnancy in second trimester 07/10/2020  . IUGR (intrauterine growth restriction) in prior pregnancy, pregnant, second trimester 07/10/2020  . Supervision of other normal pregnancy, antepartum 05/18/2020  . Chlamydia infection 10/03/2018  . Posttraumatic stress disorder   . MDD (major depressive disorder), severe (HCC) 08/09/2018    Past Surgical History:  Procedure Laterality Date  . NO PAST SURGERIES      OB History    Gravida  3   Para  1   Term  1   Preterm      AB  1   Living  1     SAB  1   IAB      Ectopic      Multiple  0   Live Births  1            Home Medications    Prior to Admission medications   Medication Sig Start Date End Date Taking? Authorizing Provider  neomycin-polymyxin-hydrocortisone (CORTISPORIN) 3.5-10000-1 OTIC suspension Place 4 drops into the left ear 3 (three) times daily for 7 days. 09/21/20 09/28/20 Yes Rhys Martini, PA-C  acetaminophen (TYLENOL) 500 MG tablet Take 1,000 mg by mouth every 6 (six) hours as needed.    [provider]  metoCLOPramide (REGLAN) 10 MG tablet Take 1 tablet (10 mg total) by mouth 3 (three) times daily with meals. Patient not taking: Reported on 07/10/2020 05/08/20 05/08/21  Rasch, Victorino Dike I, NP  ondansetron (ZOFRAN ODT) 8 MG disintegrating tablet Take 1 tablet (8 mg total) by mouth every 8 (eight) hours as needed for nausea or vomiting. Patient not taking: Reported on 07/10/2020 05/08/20   Rasch, Victorino Dike I, NP  Prenatal Vit-Fe Fumarate-FA (PRENATAL VITAMIN) 27-0.8 MG TABS Take 1 tablet by mouth daily. 07/10/20   Marny Lowenstein, PA-C  norgestimate-ethinyl estradiol (ORTHO-CYCLEN) 0.25-35 MG-MCG tablet Take 1 tablet by mouth daily. 07/20/19 03/30/20  Pulpotio Bareas Bing, MD  sertraline (ZOLOFT) 50 MG tablet Take 1 tablet (50 mg total) by mouth daily. For depression Patient not taking: Reported on 02/10/2019 08/13/18 11/17/19  Armandina Stammer I, NP  traZODone (DESYREL) 50 MG tablet Take 1 tablet (50 mg total) by mouth at bedtime as needed for sleep. Patient not taking: Reported on 12/02/2018 08/12/18 11/17/19  Sanjuana Kava, NP    Family History Family History  Problem Relation Age of Onset  . Hypertension Mother   . Hypertension Father   . Diabetes Maternal Grandmother   . Diabetes Paternal Grandmother     Social History Social History   Tobacco Use  .  Smoking status: Never Smoker  . Smokeless tobacco: Never Used  Vaping Use  . Vaping Use: Never used  Substance Use Topics  . Alcohol use: Yes  . Drug use: No     Allergies   Patient has no known allergies.   Review of Systems Review of Systems  Constitutional: Positive for chills. Negative for appetite change and fever.  HENT: Positive for congestion, ear pain and sore throat. Negative for rhinorrhea, sinus pressure and sinus pain.   Eyes: Negative for redness and visual disturbance.  Respiratory: Negative for cough, chest tightness, shortness of breath and wheezing.   Cardiovascular: Negative for chest pain and  palpitations.  Gastrointestinal: Negative for abdominal pain, constipation, diarrhea, nausea and vomiting.  Genitourinary: Negative for dysuria, frequency and urgency.  Musculoskeletal: Negative for myalgias.  Neurological: Negative for dizziness, weakness and headaches.  Psychiatric/Behavioral: Negative for confusion.  All other systems reviewed and are negative.    Physical Exam Triage Vital Signs ED Triage Vitals  Enc Vitals Group     BP 09/21/20 1706 115/76     Pulse Rate 09/21/20 1706 (!) 125     Resp 09/21/20 1706 20     Temp 09/21/20 1706 98.7 F (37.1 C)     Temp Source 09/21/20 1706 Oral     SpO2 09/21/20 1706 100 %     Weight --      Height --      Head Circumference --      Peak Flow --      Pain Score 09/21/20 1704 8     Pain Loc --      Pain Edu? --      Excl. in GC? --    No data found.  Updated Vital Signs BP 115/76 (BP Location: Right Arm)   Pulse (!) 125   Temp 98.7 F (37.1 C) (Oral)   Resp 20   LMP 02/21/2020 (Approximate)   SpO2 100%   Visual Acuity Right Eye Distance:   Left Eye Distance:   Bilateral Distance:    Right Eye Near:   Left Eye Near:    Bilateral Near:     Physical Exam Vitals reviewed.  Constitutional:      General: She is not in acute distress.    Appearance: Normal appearance. She is not ill-appearing.  HENT:     Head: Normocephalic and atraumatic.     Right Ear: Hearing, tympanic membrane, ear canal and external ear normal. No swelling or tenderness. There is no impacted cerumen. No mastoid tenderness. Tympanic membrane is not perforated, erythematous, retracted or bulging.     Left Ear: Hearing normal. Swelling and tenderness present. There is no impacted cerumen. There is mastoid tenderness. Tympanic membrane is erythematous. Tympanic membrane is not perforated, retracted or bulging.     Nose:     Right Sinus: No maxillary sinus tenderness or frontal sinus tenderness.     Left Sinus: No maxillary sinus tenderness or  frontal sinus tenderness.     Mouth/Throat:     Mouth: Mucous membranes are moist.     Pharynx: Uvula midline. No oropharyngeal exudate or posterior oropharyngeal erythema.     Tonsils: No tonsillar exudate.  Cardiovascular:     Rate and Rhythm: Normal rate and regular rhythm.     Heart sounds: Normal heart sounds.  Pulmonary:     Breath sounds: Normal breath sounds and air entry. No wheezing, rhonchi or rales.  Chest:     Chest wall: No tenderness.  Abdominal:  General: Abdomen is flat and protuberant. Bowel sounds are normal.     Tenderness: There is no abdominal tenderness. There is no guarding or rebound.     Comments: pregnant  Lymphadenopathy:     Cervical: No cervical adenopathy.  Neurological:     General: No focal deficit present.     Mental Status: She is alert and oriented to person, place, and time.  Psychiatric:        Attention and Perception: Attention and perception normal.        Mood and Affect: Mood and affect normal.        Behavior: Behavior normal. Behavior is cooperative.        Thought Content: Thought content normal.        Judgment: Judgment normal.      UC Treatments / Results  Labs (all labs ordered are listed, but only abnormal results are displayed) Labs Reviewed  RESP PANEL BY RT-PCR (FLU A&B, COVID) [id number]    EKG   Radiology No results found.  Procedures Procedures (including critical care time)  Medications Ordered in UC Medications - No data to display  Initial Impression / Assessment and Plan / UC Course  I have reviewed the triage vital signs and the nursing notes.  Pertinent labs & imaging results that were available during my care of the patient were reviewed by me and considered in my medical decision making (see chart for details).     Covid and influenza tests sent today. Patient is not vaccinated for covid-19. Isolation precautions per CDC guidelines until negative result. Symptomatic relief with OTC Mucinex,  Nyquil, etc. Return precautions- new/worsening fevers/chills, shortness of breath, chest pain, abd pain, etc.   For otitis externa, ofloxacin drops as below. Clean dry ear precautions.  As patient is currently pregnant, strict return precautions. She understands she must contact OB or seek immediate medical attention if she develops new/worsening fevers, abd pain, vaginal bleeding, confusion, etc.   Final Clinical Impressions(s) / UC Diagnoses   Final diagnoses:  Viral upper respiratory tract infection  Infective otitis externa of left ear   Discharge Instructions   None    ED Prescriptions    Medication Sig Dispense Auth. Provider   neomycin-polymyxin-hydrocortisone (CORTISPORIN) 3.5-10000-1 OTIC suspension Place 4 drops into the left ear 3 (three) times daily for 7 days. 4.2 mL Rhys Martini, PA-C     PDMP not reviewed this encounter.   Rhys Martini, PA-C 09/22/20 1010

## 2020-09-22 DIAGNOSIS — Z419 Encounter for procedure for purposes other than remedying health state, unspecified: Secondary | ICD-10-CM | POA: Diagnosis not present

## 2020-09-22 NOTE — L&D Delivery Note (Signed)
[facility] Delivery Note  LEXINGTON DEVINE is a 25 y.o. G3P1011 s/p NSVD at [redacted]w[redacted]d. She was admitted for IOL 2/2 IUGR.   ROM: 1h 52m with clear fluid GBS Status: Negative Maximum Maternal Temperature: 97.7  Labor Progress: . Admitted IOL 2/2 IUGR with steady progress all day  Delivery Date/Time: 11/13/2020 @ 1815 Delivery: Called to room and patient was complete and pushing. Head delivered LOA. With tight nuchal that I was not able to reduce. Shoulder and body delivered in usual fashion. Infant with spontaneous cry, placed on mother's abdomen, dried and stimulated. Cord clamped x 2 after 1-minute delay, and cut by family member. Cord blood drawn. Placenta delivered spontaneously with gentle cord traction. Fundus firm with massage and Pitocin. Labia, perineum, vagina, and cervix inspected inspected with no laceration.   Placenta: spontaneous, complete/intact Complications: None Lacerations: None EBL: 100cc Analgesia: None  Postpartum Planning [ ]  message to sent to schedule follow-up  [ ]  vaccines: declined flu, Needs Covid and Tdap   Infant: Female  APGARs 9/9  weight pending  DNP, CNM  11/13/20  6:31 PM

## 2020-09-24 ENCOUNTER — Ambulatory Visit: Payer: Managed Care, Other (non HMO)

## 2020-09-27 ENCOUNTER — Encounter: Payer: Managed Care, Other (non HMO) | Admitting: Student

## 2020-10-04 ENCOUNTER — Encounter: Payer: Self-pay | Admitting: *Deleted

## 2020-10-04 ENCOUNTER — Encounter: Payer: Managed Care, Other (non HMO) | Admitting: Certified Nurse Midwife

## 2020-10-04 ENCOUNTER — Other Ambulatory Visit: Payer: Self-pay | Admitting: *Deleted

## 2020-10-04 ENCOUNTER — Other Ambulatory Visit: Payer: Self-pay

## 2020-10-04 ENCOUNTER — Ambulatory Visit: Payer: 59 | Admitting: *Deleted

## 2020-10-04 ENCOUNTER — Ambulatory Visit: Payer: 59 | Attending: Maternal & Fetal Medicine

## 2020-10-04 DIAGNOSIS — O365930 Maternal care for other known or suspected poor fetal growth, third trimester, not applicable or unspecified: Secondary | ICD-10-CM | POA: Diagnosis not present

## 2020-10-04 DIAGNOSIS — O09292 Supervision of pregnancy with other poor reproductive or obstetric history, second trimester: Secondary | ICD-10-CM

## 2020-10-04 DIAGNOSIS — Z3480 Encounter for supervision of other normal pregnancy, unspecified trimester: Secondary | ICD-10-CM | POA: Diagnosis not present

## 2020-10-04 DIAGNOSIS — O99213 Obesity complicating pregnancy, third trimester: Secondary | ICD-10-CM

## 2020-10-04 DIAGNOSIS — E669 Obesity, unspecified: Secondary | ICD-10-CM

## 2020-10-04 DIAGNOSIS — Z3A32 32 weeks gestation of pregnancy: Secondary | ICD-10-CM

## 2020-10-04 DIAGNOSIS — O09293 Supervision of pregnancy with other poor reproductive or obstetric history, third trimester: Secondary | ICD-10-CM

## 2020-10-04 DIAGNOSIS — O0933 Supervision of pregnancy with insufficient antenatal care, third trimester: Secondary | ICD-10-CM

## 2020-10-11 ENCOUNTER — Other Ambulatory Visit: Payer: Managed Care, Other (non HMO)

## 2020-10-12 ENCOUNTER — Ambulatory Visit: Payer: 59 | Attending: Obstetrics and Gynecology

## 2020-10-12 ENCOUNTER — Ambulatory Visit: Payer: 59 | Admitting: *Deleted

## 2020-10-12 ENCOUNTER — Encounter: Payer: Self-pay | Admitting: *Deleted

## 2020-10-12 ENCOUNTER — Other Ambulatory Visit: Payer: Self-pay

## 2020-10-12 DIAGNOSIS — O365930 Maternal care for other known or suspected poor fetal growth, third trimester, not applicable or unspecified: Secondary | ICD-10-CM

## 2020-10-12 DIAGNOSIS — E669 Obesity, unspecified: Secondary | ICD-10-CM

## 2020-10-12 DIAGNOSIS — O09292 Supervision of pregnancy with other poor reproductive or obstetric history, second trimester: Secondary | ICD-10-CM | POA: Diagnosis not present

## 2020-10-12 DIAGNOSIS — Z3480 Encounter for supervision of other normal pregnancy, unspecified trimester: Secondary | ICD-10-CM

## 2020-10-12 DIAGNOSIS — O0933 Supervision of pregnancy with insufficient antenatal care, third trimester: Secondary | ICD-10-CM | POA: Diagnosis not present

## 2020-10-12 DIAGNOSIS — O99213 Obesity complicating pregnancy, third trimester: Secondary | ICD-10-CM

## 2020-10-12 DIAGNOSIS — O09293 Supervision of pregnancy with other poor reproductive or obstetric history, third trimester: Secondary | ICD-10-CM

## 2020-10-12 DIAGNOSIS — Z3A33 33 weeks gestation of pregnancy: Secondary | ICD-10-CM

## 2020-10-19 ENCOUNTER — Ambulatory Visit: Payer: Medicaid Other | Attending: Obstetrics and Gynecology

## 2020-10-19 ENCOUNTER — Ambulatory Visit: Payer: Medicaid Other

## 2020-10-23 DIAGNOSIS — Z419 Encounter for procedure for purposes other than remedying health state, unspecified: Secondary | ICD-10-CM | POA: Diagnosis not present

## 2020-10-25 ENCOUNTER — Telehealth (INDEPENDENT_AMBULATORY_CARE_PROVIDER_SITE_OTHER): Payer: 59 | Admitting: Student

## 2020-10-25 DIAGNOSIS — Z3A35 35 weeks gestation of pregnancy: Secondary | ICD-10-CM

## 2020-10-25 DIAGNOSIS — O09293 Supervision of pregnancy with other poor reproductive or obstetric history, third trimester: Secondary | ICD-10-CM

## 2020-10-25 DIAGNOSIS — O09292 Supervision of pregnancy with other poor reproductive or obstetric history, second trimester: Secondary | ICD-10-CM | POA: Diagnosis not present

## 2020-10-25 DIAGNOSIS — O99343 Other mental disorders complicating pregnancy, third trimester: Secondary | ICD-10-CM | POA: Diagnosis not present

## 2020-10-25 DIAGNOSIS — O0933 Supervision of pregnancy with insufficient antenatal care, third trimester: Secondary | ICD-10-CM | POA: Diagnosis not present

## 2020-10-25 DIAGNOSIS — O365930 Maternal care for other known or suspected poor fetal growth, third trimester, not applicable or unspecified: Secondary | ICD-10-CM | POA: Diagnosis not present

## 2020-10-25 DIAGNOSIS — Z3480 Encounter for supervision of other normal pregnancy, unspecified trimester: Secondary | ICD-10-CM

## 2020-10-25 DIAGNOSIS — F329 Major depressive disorder, single episode, unspecified: Secondary | ICD-10-CM

## 2020-10-25 DIAGNOSIS — F4310 Post-traumatic stress disorder, unspecified: Secondary | ICD-10-CM

## 2020-10-25 MED ORDER — CYCLOBENZAPRINE HCL 10 MG PO TABS: 10.0000 mg | ORAL_TABLET | Freq: Three times a day (TID) | ORAL | 1 refills | Status: DC | PRN

## 2020-10-25 NOTE — Patient Instructions (Signed)
Considering Waterbirth? Guide for patients at [facility] ([facility]) Why consider waterbirth? . Gentle birth for babies  . Less pain medicine used in labor  . May allow for passive descent/less pushing  . May reduce perineal tears  . More mobility and instinctive maternal position changes  . Increased maternal relaxation   Is waterbirth safe? What are the risks of infection, drowning or other complications? . Infection:  . Very low risk (3.7 % for tub vs 4.8% for bed)  . 7 in 8000 waterbirths with documented infection  . Poorly cleaned equipment most common cause  . Slightly lower group B strep transmission rate  . Drowning  . Maternal:  . Very low risk  . Related to seizures or fainting  . Newborn:  . Very low risk. No evidence of increased risk of respiratory problems in multiple large studies  . Physiological protection from breathing under water  . Avoid underwater birth if there are any fetal complications  . Once baby's head is out of the water, keep it out.  . Birth complication  . Some reports of cord trauma, but risk decreased by bringing baby to surface gradually  . No evidence of increased risk of shoulder dystocia. Mothers can usually change positions faster in water than in a bed, possibly aiding the maneuvers to free the shoulder.   There are 2 things you MUST do to have a waterbirth with [facility]: 1. Attend a waterbirth class at [facility] [place]   a. 3rd Wednesday of every month from 7-9 pm (virtual during COVID) b. Free c. Register by calling [phone] or register online at [email or web address] d. Bring us the certificate from the class to your prenatal appointment or send via MyChart 2. Meet with a midwife at 36 weeks* to see if you can still plan a waterbirth and to sign the consent.   *We also recommend that you schedule as many of your prenatal visits with a midwife as possible.    Helpful information: . You may  want to bring a bathing suit top to the hospital to wear during labor but this is optional.  All other supplies are provided by the hospital. . Please arrive at the hospital with signs of active labor, and do not wait at home until late in labor. It takes 45 min- 2 hours for COVID testing, fetal monitoring, and check in to your room to take place, plus transport and filling of the waterbirth tub.    Things that would prevent you from having a waterbirth: . Unknown or Positive COVID-19 diagnosis upon admission to hospital* . Premature, <37wks  . Previous cesarean birth  . Presence of thick meconium-stained fluid  . Multiple gestation (Twins, triplets, etc.)  . Uncontrolled diabetes or gestational diabetes requiring medication  . Hypertension diagnosed in pregnancy or preexisting hypertension (gestational hypertension, preeclampsia, or chronic hypertension) . Heavy vaginal bleeding  . Non-reassuring fetal heart rate  . Active infection (MRSA, etc.). Group B Strep is NOT a contraindication for waterbirth.  . If your labor has to be induced and induction method requires continuous monitoring of the baby's heart rate  . Other risks/issues identified by your obstetrical provider   Please remember that birth is unpredictable. Under certain unforeseeable circumstances your provider may advise against giving birth in the tub. These decisions will be made on a case-by-case basis and with the safety of you and your baby as our highest priority.   *Please remember that in order   to have a waterbirth, you must test Negative to COVID-19 upon admission to the hospital.  Updated 08/07/2020  

## 2020-10-25 NOTE — Progress Notes (Signed)
I connected with  Jamie Moore on 10/25/20 at  1:15 PM EST by telephone and verified that I am speaking with the correct person using two identifiers.   I discussed the limitations, risks, security and privacy concerns of performing an evaluation and management service by telephone and the availability of in person appointments. I also discussed with the patient that there may be a patient responsible charge related to this service. The patient expressed understanding and agreed to proceed.  [name], RN 10/25/2020  1:21 PM

## 2020-10-25 NOTE — Progress Notes (Signed)
Pt does have BP cuff available to take today. Pt states having pelvic pain and both legs x 1 week. Pt has tried pregnancy yoga and has not helped. Pt states "thought it was sciatic nerve" Pt rates pain as 9 on pain scale.

## 2020-10-25 NOTE — Progress Notes (Signed)
Patient ID: Jamie Moore, female   DOB: Mar 04, 1996, 25 y.o.   MRN: [id number] I connected with Jamie Moore 10/25/20 at  1:15 PM EST by: MyChart video and verified that I am speaking with the correct person using two identifiers.  Patient is located at home and provider is located at [employer].     The purpose of this virtual visit is to provide medical care while limiting exposure to the novel coronavirus. I discussed the limitations, risks, security and privacy concerns of performing an evaluation and management service by MyChart video and the availability of in person appointments. I also discussed with the patient that there may be a patient responsible charge related to this service. By engaging in this virtual visit, you consent to the provision of healthcare.  Additionally, you authorize for your insurance to be billed for the services provided during this visit.  The patient expressed understanding and agreed to proceed The following staff members participated in the virtual visit:  Stephanie shytle    PRENATAL VISIT NOTE  Subjective:  Jamie Moore is a 25 y.o. G3P1011 at [redacted]w[redacted]d  for phone visit for ongoing prenatal care.  She is currently monitored for the following issues for this high-risk pregnancy and has MDD (major depressive disorder), severe (HCC); Posttraumatic stress disorder; Chlamydia infection; Supervision of other normal pregnancy, antepartum; Late prenatal care affecting pregnancy in second trimester; and IUGR (intrauterine growth restriction) in prior pregnancy, pregnant, second trimester on their problem list.  Patient reports back and leg pain. She reports that she has tried yoga, which did not help. . She has been followed for FGR; she states that MFM will let her know at 37 weeks if she will be induced at that time or if she will be monitored further.  Contractions: Not present. Vag. Bleeding: None.  Movement: Present. Denies leaking of fluid.   The following  portions of the patient's history were reviewed and updated as appropriate: allergies, current medications, past family history, past medical history, past social history, past surgical history and problem list.   Objective:  There were no vitals filed for this visit. Self-Obtained  Fetal Status:     Movement: Present     Assessment and Plan:  Pregnancy: G3P1011 at 103w2d 1. IUGR (intrauterine growth restriction) in prior pregnancy, pregnant, second trimester -patient will keep [place] appts for FGR; understands that if baby doesn't tolerate labor she will not be able to have WB. Patient ok with IOL if MFM recommends it.  -patient will come and get diapers from pregnancy navigator -will have GTT ASAP next week (cannot come tomorrow); understands if she is uncontrolled diabetic she will not be able to have WB 2. Supervision of other normal pregnancy, antepartum -RX for flexeril given, referral to PT given -patient will bring waterbirth certificate for next visit.   Preterm labor symptoms and general obstetric precautions including but not limited to vaginal bleeding, contractions, leaking of fluid and fetal movement were reviewed in detail with the patient.  Return in about 1 week (around 11/01/2020), or needs 2 hour GTT ASAP on next mon or tues,  needs in person provider visit next week for 36 wk labs..  Future Appointments  Date Time Provider Department Center  10/26/2020  1:00 PM [facility] NURSE [facility]  10/26/2020  1:15 PM [facility] US2 [facility]  11/02/2020  1:45 PM [facility] NURSE [facility]  11/02/2020  2:00 PM [facility] US1 [facility]     Time spent on virtual visit:  30 minutes  Marylene Land, [place]

## 2020-10-26 ENCOUNTER — Ambulatory Visit: Payer: Medicaid Other | Attending: Obstetrics and Gynecology

## 2020-10-26 ENCOUNTER — Ambulatory Visit: Payer: Medicaid Other

## 2020-11-02 ENCOUNTER — Other Ambulatory Visit: Payer: Self-pay | Admitting: *Deleted

## 2020-11-02 ENCOUNTER — Ambulatory Visit: Payer: 59 | Admitting: *Deleted

## 2020-11-02 ENCOUNTER — Encounter: Payer: Self-pay | Admitting: *Deleted

## 2020-11-02 ENCOUNTER — Other Ambulatory Visit: Payer: Self-pay

## 2020-11-02 ENCOUNTER — Ambulatory Visit: Payer: 59 | Attending: Obstetrics and Gynecology

## 2020-11-02 DIAGNOSIS — E669 Obesity, unspecified: Secondary | ICD-10-CM | POA: Diagnosis not present

## 2020-11-02 DIAGNOSIS — Z3480 Encounter for supervision of other normal pregnancy, unspecified trimester: Secondary | ICD-10-CM | POA: Insufficient documentation

## 2020-11-02 DIAGNOSIS — O09292 Supervision of pregnancy with other poor reproductive or obstetric history, second trimester: Secondary | ICD-10-CM

## 2020-11-02 DIAGNOSIS — O365930 Maternal care for other known or suspected poor fetal growth, third trimester, not applicable or unspecified: Secondary | ICD-10-CM

## 2020-11-02 DIAGNOSIS — O0933 Supervision of pregnancy with insufficient antenatal care, third trimester: Secondary | ICD-10-CM | POA: Diagnosis not present

## 2020-11-02 DIAGNOSIS — O99213 Obesity complicating pregnancy, third trimester: Secondary | ICD-10-CM | POA: Diagnosis not present

## 2020-11-02 DIAGNOSIS — Z3A36 36 weeks gestation of pregnancy: Secondary | ICD-10-CM

## 2020-11-02 DIAGNOSIS — O09293 Supervision of pregnancy with other poor reproductive or obstetric history, third trimester: Secondary | ICD-10-CM

## 2020-11-12 ENCOUNTER — Ambulatory Visit: Payer: Medicaid Other | Admitting: *Deleted

## 2020-11-12 ENCOUNTER — Other Ambulatory Visit: Payer: Self-pay

## 2020-11-12 ENCOUNTER — Encounter: Payer: Self-pay | Admitting: *Deleted

## 2020-11-12 ENCOUNTER — Telehealth: Payer: Self-pay | Admitting: Obstetrics & Gynecology

## 2020-11-12 ENCOUNTER — Ambulatory Visit (HOSPITAL_BASED_OUTPATIENT_CLINIC_OR_DEPARTMENT_OTHER): Payer: Medicaid Other

## 2020-11-12 DIAGNOSIS — O0933 Supervision of pregnancy with insufficient antenatal care, third trimester: Secondary | ICD-10-CM

## 2020-11-12 DIAGNOSIS — O99213 Obesity complicating pregnancy, third trimester: Secondary | ICD-10-CM

## 2020-11-12 DIAGNOSIS — O09293 Supervision of pregnancy with other poor reproductive or obstetric history, third trimester: Secondary | ICD-10-CM

## 2020-11-12 DIAGNOSIS — Z3480 Encounter for supervision of other normal pregnancy, unspecified trimester: Secondary | ICD-10-CM

## 2020-11-12 DIAGNOSIS — O365930 Maternal care for other known or suspected poor fetal growth, third trimester, not applicable or unspecified: Secondary | ICD-10-CM | POA: Insufficient documentation

## 2020-11-12 DIAGNOSIS — O09292 Supervision of pregnancy with other poor reproductive or obstetric history, second trimester: Secondary | ICD-10-CM

## 2020-11-12 DIAGNOSIS — Z3A37 37 weeks gestation of pregnancy: Secondary | ICD-10-CM

## 2020-11-12 NOTE — Telephone Encounter (Signed)
Spoke with Jamie Moore regarding IOL who states patient should be offered induction for tomorrow. Called L&D and scheduled IOL for tomorrow AM. Called & discussed with patient. Patient verbalized understanding.

## 2020-11-12 NOTE — Telephone Encounter (Signed)
Patient was seen in MFM, was told her baby was small and that she my need to be induced, patient also state she is in pain.

## 2020-11-13 ENCOUNTER — Inpatient Hospital Stay (HOSPITAL_COMMUNITY)
Admission: AD | Admit: 2020-11-13 | Discharge: 2020-11-15 | DRG: 807 | Disposition: A | Discharge status: Home / Self Care | Payer: Medicaid Other | Attending: Family Medicine | Admitting: Family Medicine

## 2020-11-13 ENCOUNTER — Inpatient Hospital Stay (HOSPITAL_COMMUNITY): Payer: Medicaid Other

## 2020-11-13 ENCOUNTER — Encounter (HOSPITAL_COMMUNITY): Payer: Self-pay | Admitting: Obstetrics & Gynecology

## 2020-11-13 DIAGNOSIS — O0932 Supervision of pregnancy with insufficient antenatal care, second trimester: Secondary | ICD-10-CM

## 2020-11-13 DIAGNOSIS — Z3A38 38 weeks gestation of pregnancy: Secondary | ICD-10-CM

## 2020-11-13 DIAGNOSIS — Z8616 Personal history of COVID-19: Secondary | ICD-10-CM | POA: Diagnosis not present

## 2020-11-13 DIAGNOSIS — O365930 Maternal care for other known or suspected poor fetal growth, third trimester, not applicable or unspecified: Principal | ICD-10-CM | POA: Diagnosis present

## 2020-11-13 DIAGNOSIS — O09292 Supervision of pregnancy with other poor reproductive or obstetric history, second trimester: Secondary | ICD-10-CM

## 2020-11-13 DIAGNOSIS — O0990 Supervision of high risk pregnancy, unspecified, unspecified trimester: Secondary | ICD-10-CM | POA: Insufficient documentation

## 2020-11-13 LAB — CBC
HCT: 34 % — ABNORMAL LOW (ref 36.0–46.0)
Hemoglobin: 11.7 g/dL — ABNORMAL LOW (ref 12.0–15.0)
MCH: 29.6 pg (ref 26.0–34.0)
MCHC: 34.4 g/dL (ref 30.0–36.0)
MCV: 86.1 fL (ref 80.0–100.0)
Platelets: 194 10*3/uL (ref 150–400)
RBC: 3.95 MIL/uL (ref 3.87–5.11)
RDW: 13.5 % (ref 11.5–15.5)
WBC: 7.7 10*3/uL (ref 4.0–10.5)
nRBC: 0 % (ref 0.0–0.2)

## 2020-11-13 LAB — TYPE AND SCREEN
ABO/RH(D): O POS
Antibody Screen: NEGATIVE

## 2020-11-13 LAB — GROUP B STREP BY PCR: Group B strep by PCR: NEGATIVE

## 2020-11-13 LAB — RPR: RPR Ser Ql: NONREACTIVE

## 2020-11-13 MED ORDER — OXYCODONE HCL 5 MG PO TABS
5.0000 mg | ORAL_TABLET | ORAL | Status: DC | PRN
  Administered 2020-11-15: 5 mg via ORAL
  Filled 2020-11-13: qty 1

## 2020-11-13 MED ORDER — OXYCODONE-ACETAMINOPHEN 5-325 MG PO TABS: 1.0000 | ORAL_TABLET | ORAL | Status: DC | PRN

## 2020-11-13 MED ORDER — WITCH HAZEL-GLYCERIN EX PADS: 1.0000 "application " | MEDICATED_PAD | CUTANEOUS | Status: DC | PRN

## 2020-11-13 MED ORDER — COCONUT OIL OIL: 1.0000 "application " | TOPICAL_OIL | Status: DC | PRN

## 2020-11-13 MED ORDER — OXYCODONE HCL 5 MG PO TABS
10.0000 mg | ORAL_TABLET | ORAL | Status: DC | PRN
  Administered 2020-11-14: 10 mg via ORAL
  Filled 2020-11-13: qty 2

## 2020-11-13 MED ORDER — OXYTOCIN-SODIUM CHLORIDE 30-0.9 UT/500ML-% IV SOLN
1.0000 m[IU]/min | INTRAVENOUS | Status: DC
  Administered 2020-11-13: 2 m[IU]/min via INTRAVENOUS

## 2020-11-13 MED ORDER — ONDANSETRON HCL 4 MG/2ML IJ SOLN: 4.0000 mg | Freq: Four times a day (QID) | INTRAMUSCULAR | Status: DC | PRN

## 2020-11-13 MED ORDER — LACTATED RINGERS IV SOLN: 500.0000 mL | INTRAVENOUS | Status: DC | PRN

## 2020-11-13 MED ORDER — MISOPROSTOL 50MCG HALF TABLET: 50.0000 ug | ORAL_TABLET | Freq: Once | ORAL | Status: AC

## 2020-11-13 MED ORDER — TERBUTALINE SULFATE 1 MG/ML IJ SOLN: 0.2500 mg | Freq: Once | INTRAMUSCULAR | Status: DC | PRN

## 2020-11-13 MED ORDER — LACTATED RINGERS IV SOLN: INTRAVENOUS | Status: DC

## 2020-11-13 MED ORDER — DIPHENHYDRAMINE HCL 25 MG PO CAPS: 25.0000 mg | ORAL_CAPSULE | Freq: Four times a day (QID) | ORAL | Status: DC | PRN

## 2020-11-13 MED ORDER — PRENATAL MULTIVITAMIN CH
1.0000 | ORAL_TABLET | Freq: Every day | ORAL | Status: DC
Start: 2020-11-14 — End: 2020-11-15
  Administered 2020-11-14 – 2020-11-15 (×2): 1 via ORAL
  Filled 2020-11-13 (×2): qty 1

## 2020-11-13 MED ORDER — ACETAMINOPHEN 325 MG PO TABS: 650.0000 mg | ORAL_TABLET | ORAL | Status: DC | PRN

## 2020-11-13 MED ORDER — ONDANSETRON HCL 4 MG/2ML IJ SOLN: 4.0000 mg | INTRAMUSCULAR | Status: DC | PRN

## 2020-11-13 MED ORDER — TETANUS-DIPHTH-ACELL PERTUSSIS 5-2.5-18.5 LF-MCG/0.5 IM SUSY
0.5000 mL | PREFILLED_SYRINGE | Freq: Once | INTRAMUSCULAR | Status: DC
Start: 2020-11-14 — End: 2020-11-15

## 2020-11-13 MED ORDER — ZOLPIDEM TARTRATE 5 MG PO TABS: 5.0000 mg | ORAL_TABLET | Freq: Every evening | ORAL | Status: DC | PRN

## 2020-11-13 MED ORDER — ONDANSETRON HCL 4 MG PO TABS: 4.0000 mg | ORAL_TABLET | ORAL | Status: DC | PRN

## 2020-11-13 MED ORDER — OXYTOCIN-SODIUM CHLORIDE 30-0.9 UT/500ML-% IV SOLN
2.5000 [IU]/h | INTRAVENOUS | Status: DC
  Administered 2020-11-13: 2.5 [IU]/h via INTRAVENOUS
  Filled 2020-11-13: qty 500

## 2020-11-13 MED ORDER — LIDOCAINE HCL (PF) 1 % IJ SOLN: 30.0000 mL | INTRAMUSCULAR | Status: DC | PRN

## 2020-11-13 MED ORDER — SIMETHICONE 80 MG PO CHEW: 80.0000 mg | CHEWABLE_TABLET | ORAL | Status: DC | PRN

## 2020-11-13 MED ORDER — DIBUCAINE (PERIANAL) 1 % EX OINT: 1.0000 "application " | TOPICAL_OINTMENT | CUTANEOUS | Status: DC | PRN

## 2020-11-13 MED ORDER — BENZOCAINE-MENTHOL 20-0.5 % EX AERO: 1.0000 "application " | INHALATION_SPRAY | CUTANEOUS | Status: DC | PRN

## 2020-11-13 MED ORDER — SOD CITRATE-CITRIC ACID 500-334 MG/5ML PO SOLN: 30.0000 mL | ORAL | Status: DC | PRN

## 2020-11-13 MED ORDER — OXYTOCIN BOLUS FROM INFUSION
333.0000 mL | Freq: Once | INTRAVENOUS | Status: AC
  Administered 2020-11-13: 333 mL via INTRAVENOUS

## 2020-11-13 MED ORDER — SENNOSIDES-DOCUSATE SODIUM 8.6-50 MG PO TABS
2.0000 | ORAL_TABLET | ORAL | Status: DC
  Administered 2020-11-14: 2 via ORAL
  Filled 2020-11-13: qty 2

## 2020-11-13 MED ORDER — ACETAMINOPHEN 325 MG PO TABS
650.0000 mg | ORAL_TABLET | ORAL | Status: DC | PRN
  Administered 2020-11-13: 650 mg via ORAL
  Filled 2020-11-13: qty 2

## 2020-11-13 MED ORDER — MISOPROSTOL 50MCG HALF TABLET
ORAL_TABLET | ORAL | Status: AC
  Administered 2020-11-13: 50 ug via BUCCAL
  Filled 2020-11-13: qty 1

## 2020-11-13 MED ORDER — OXYCODONE-ACETAMINOPHEN 5-325 MG PO TABS: 2.0000 | ORAL_TABLET | ORAL | Status: DC | PRN

## 2020-11-13 MED ORDER — IBUPROFEN 600 MG PO TABS
600.0000 mg | ORAL_TABLET | Freq: Four times a day (QID) | ORAL | Status: DC
  Administered 2020-11-13 – 2020-11-15 (×7): 600 mg via ORAL
  Filled 2020-11-13 (×6): qty 1

## 2020-11-13 NOTE — H&P (Addendum)
OBSTETRIC ADMISSION HISTORY AND PHYSICAL  Jamie Moore is a 25 y.o. female G3P1011 with IUP at [redacted]w[redacted]d by LMP presenting for IOL secondary to IUGR. She reports +FMs, No LOF, no VB, no blurry vision, headaches or peripheral edema, and RUQ pain.  She plans on breast feeding. She requests POPs for birth control.  She received her prenatal care at [facility]   Dating: By LMP --->  Estimated Date of Delivery: 11/27/20  Sono:  @[redacted]w[redacted]d , CWD, normal anatomy, cephalic presentation, 2752g, EFW   Prenatal History/Complications:  - IUGR (diagnosed at [redacted]w[redacted]d with EFW 6%) Normal dopplers.  - COVID in pregnancy (diagnosed 09/21/20)  Past Medical History: Past Medical History:  Diagnosis Date  . Anxiety   . Headache     Past Surgical History: Past Surgical History:  Procedure Laterality Date  . NO PAST SURGERIES      Obstetrical History: OB History    Gravida  3   Para  1   Term  1   Preterm      AB  1   Living  1     SAB  1   IAB      Ectopic      Multiple  0   Live Births  1           Social History Social History   Socioeconomic History  . Marital status: Single    Spouse name: Not on file  . Number of children: Not on file  . Years of education: Not on file  . Highest education level: Not on file  Occupational History  . Not on file  Tobacco Use  . Smoking status: Never Smoker  . Smokeless tobacco: Never Used  Vaping Use  . Vaping Use: Never used  Substance and Sexual Activity  . Alcohol use: Yes  . Drug use: No  . Sexual activity: Yes    Birth control/protection: None  Other Topics Concern  . Not on file  Social History Narrative  . Not on file   Social Determinants of Health   Financial Resource Strain: Not on file  Food Insecurity: No Food Insecurity  . Worried About 09/23/20 in the Last Year: Never true  . Ran Out of Food in the Last Year: Never true  Transportation Needs: Unmet Transportation Needs  . Lack of Transportation  (Medical): Yes  . Lack of Transportation (Non-Medical): Yes  Physical Activity: Not on file  Stress: Not on file  Social Connections: Not on file    Family History: Family History  Problem Relation Age of Onset  . Hypertension Mother   . Hypertension Father   . Diabetes Maternal Grandmother   . Diabetes Paternal Grandmother     Allergies: No Known Allergies  Medications Prior to Admission  Medication Sig Dispense Refill Last Dose  . acetaminophen (TYLENOL) 500 MG tablet Take 1,000 mg by mouth every 6 (six) hours as needed.     . cyclobenzaprine (FLEXERIL) 10 MG tablet Take 1 tablet (10 mg total) by mouth every 8 (eight) hours as needed for muscle spasms. 30 tablet 1   . metoCLOPramide (REGLAN) 10 MG tablet Take 1 tablet (10 mg total) by mouth 3 (three) times daily with meals. (Patient not taking: No sig reported) 90 tablet 0   . ondansetron (ZOFRAN ODT) 8 MG disintegrating tablet Take 1 tablet (8 mg total) by mouth every 8 (eight) hours as needed for nausea or vomiting. (Patient not taking: No sig reported) 20  tablet 0   . Prenatal Vit-Fe Fumarate-FA (PRENATAL VITAMIN) 27-0.8 MG TABS Take 1 tablet by mouth daily. 30 tablet 6      Review of Systems   All systems reviewed and negative except as stated in HPI  Last menstrual period 02/21/2020. General appearance: alert, cooperative and appears stated age Lungs: normal WOB Heart: regular rate  Abdomen: soft, non-tender Extremities: no sign of DVT Presentation: cephalic by bedside u/s Fetal monitoringBaseline: 150 bpm, Variability: Moderate, Accelerations: Reactive and Decelerations: Absent Uterine activityNone    Prenatal labs: ABO, Rh: O/Positive/-- (10/19 1149) Antibody: Negative (10/19 1149) Rubella: 4.87 (10/19 1149) RPR: Non Reactive (12/22 0855)  HBsAg: Negative (10/19 1149)  HIV: Non Reactive (12/22 0855)  GBS:   unknown (PCR ordered on admission) 1 hr Glucola not performed (A1c 5.1% on 07/10/20) Genetic  screening wnl Anatomy US wnl  Prenatal Transfer Tool  Maternal Diabetes: No (pt did not present for gtt but A1c 5.1% on 07/10/20) Genetic Screening: Normal Maternal Ultrasounds/Referrals: IUGR (diganosed at [redacted]w[redacted]d with EFW 6%) Fetal Ultrasounds or other Referrals:  None Maternal Substance Abuse:  No Significant Maternal Medications:  None Significant Maternal Lab Results: GBS unknown (PCR collected on admission)  No results found for this or any previous visit (from the past 24 hour(s)).  Patient Active Problem List   Diagnosis Date Noted  . Supervision of high risk pregnancy, antepartum 11/13/2020  . Late prenatal care affecting pregnancy in second trimester 07/10/2020  . IUGR (intrauterine growth restriction) in prior pregnancy, pregnant, second trimester 07/10/2020  . Supervision of other normal pregnancy, antepartum 05/18/2020  . Chlamydia infection 10/03/2018  . Posttraumatic stress disorder   . MDD (major depressive disorder), severe (HCC) 08/09/2018    Assessment/Plan:  Jamie Moore is a 25 y.o. G3P1011 at [redacted]w[redacted]d here for IOL secondary to IUGR.  #Labor: Cervix unfavorable.  Patient declines FB for now.  Start with Cytotec 50 mcg buccally for cervical ripening.   #Pain: TBD per pt request #FWB: Cat I #ID: GBS unknown; PCR ordered on admission and negative #MOF: Breast #MOC: POPs #Circ: Desired  EMILY Genene Churn, MD  11/13/2020, 11:29 AM   I saw and evaluated the patient. I agree with the findings and the plan of care as documented in the resident's note.  Casper Harrison, MD [facility] Family Medicine Fellow, [facility] for [facility], [facility]

## 2020-11-13 NOTE — Discharge Summary (Addendum)
Postpartum Discharge Summary     Patient Name: Jamie Moore DOB: 03-09-1996 MRN: [id number]  Date of admission: 11/13/2020 Delivery date:11/13/2020  Delivering provider: Marcille Buffy D  Date of discharge: 11/15/2020  Admitting diagnosis: Supervision of high risk pregnancy, antepartum [O09.90] Intrauterine pregnancy: [redacted]w[redacted]d    Secondary diagnosis:  Active Problems:   Late prenatal care affecting pregnancy in second trimester   IUGR (intrauterine growth restriction) in prior pregnancy, pregnant, second trimester  Additional problems: none    Discharge diagnosis: Term Pregnancy Delivered                                              Post partum procedures:none Augmentation: Pitocin and Cytotec Complications: None  Hospital course: Induction of Labor With Vaginal Delivery   25y.o. yo G3P1011 at 316w0das admitted to the hospital 11/13/2020 for induction of labor.  Indication for induction: IUGR (dx at 28wks with EFW 6th% and normal dopplers). [facility] was also [facility]. Patient had an uncomplicated labor course as follows: Membrane Rupture Time/Date: 5:05 PM ,11/13/2020   Delivery Method:Vaginal, Spontaneous  Episiotomy: None  Lacerations:  None  Details of delivery can be found in separate delivery note.  Patient had a routine postpartum course. She desires a circ for her son- consent note placed in his chart. Patient is discharged home 11/15/20.  Newborn Data: Birth date:11/13/2020  Birth time:6:15 PM  Gender:Female  Living status:Living  Apgars:8 ,8  Weight:2775 g (6lb 1.9oz)  Magnesium Sulfate received: No BMZ received: No Rhophylac:N/A MMR:N/A T-DaP:Needs Flu: No Transfusion:No  Physical exam  Vitals:   11/14/20 0926 11/14/20 1448 11/14/20 2130 11/15/20 0546  BP: 113/73 120/81 120/87 133/64  Pulse: (!) 104 91 96 91  Resp: _0 Temp: 97.7 F (36.5 C) 97.8 F (36.6 C) 98 F (36.7 C) 97.9 F (36.6  C)  TempSrc: Oral Oral Oral Oral  SpO2:  100% 100% 100%  Weight:      Height:       General: alert and cooperative Lochia: appropriate Uterine Fundus: firm Incision: N/A DVT Evaluation: No evidence of DVT seen on physical exam. Labs: Lab Results  Component Value Date   WBC 7.7 11/13/2020   HGB 11.7 (L) 11/13/2020   HCT 34.0 (L) 11/13/2020   MCV 86.1 11/13/2020   PLT 194 11/13/2020   CMP Latest Ref Rng & Units 05/08/2020  Glucose 70 - 99 mg/dL 97  BUN 6 - 20 mg/dL 5(L)  Creatinine 0.44 - 1.00 mg/dL 0.56  Sodium 135 - 145 mmol/L 134(L)  Potassium 3.5 - 5.1 mmol/L 3.8  Chloride 98 - 111 mmol/L 104  CO2 22 - 32 mmol/L 23  Calcium 8.9 - 10.3 mg/dL 8.7(L)  Total Protein 6.5 - 8.1 g/dL 6.3(L)  Total Bilirubin 0.3 - 1.2 mg/dL 0.5  Alkaline Phos 38 - 126 U/L 39  AST 15 - 41 U/L 15  ALT 0 - 44 U/L 10   Edinburgh Score: Edinburgh Postnatal Depression Scale Screening Tool 11/14/2020  I have been able to laugh and see the funny side of things. 0  I have looked forward with enjoyment to things. 0  I have blamed myself unnecessarily when things went wrong. 1  I have been anxious or worried for no good reason. 0  I  have felt scared or panicky for no good reason. 0  Things have been getting on top of me. 1  I have been so unhappy that I have had difficulty sleeping. 0  I have felt sad or miserable. 1  I have been so unhappy that I have been crying. 1  The thought of harming myself has occurred to me. 0  Edinburgh Postnatal Depression Scale Total 4     After visit meds:  Allergies as of 11/15/2020   No Known Allergies     Medication List    STOP taking these medications   acetaminophen 500 MG tablet Commonly known as: TYLENOL   cyclobenzaprine 10 MG tablet Commonly known as: FLEXERIL     TAKE these medications   ibuprofen 600 MG tablet Commonly known as: ADVIL Take 1 tablet (600 mg total) by mouth every 6 (six) hours as needed.   Prenatal Vitamin 27-0.8 MG  Tabs Take 1 tablet by mouth daily.        Discharge home in stable condition Infant Feeding: Breast Infant Disposition:home with mother Discharge instruction: per After Visit Summary and Postpartum booklet. Activity: Advance as tolerated. Pelvic rest for 6 weeks.  Diet: routine diet Future Appointments: Future Appointments  Date Time Provider [place]  12/11/2020  1:15 PM Marsala, Placido Sou, MD [facility]   Follow up Visit:   Please schedule this patient for a In person postpartum visit in 4 weeks with the following provider: Any provider. Additional Postpartum F/U:None   High risk pregnancy complicated by: IUGR, limited prenatal care  Delivery mode:  Vaginal, Spontaneous  Anticipated Birth Control:  POPs   11/15/2020 Myrtis Ser, CNM  8:06 AM

## 2020-11-13 NOTE — Lactation Note (Signed)
This note was copied from a baby's chart. Lactation Consultation Note  Patient Name: Jamie Moore [id number] Date: 11/13/2020 Reason for consult: Initial assessment;Early term 37-38.6wks Age:23 hours LC entered the room, mom was holding infant, per mom, infant has  been sleepy and not latching at the breast. Mom did hand expression and infant was given 4 mls of colostrum by spoon, afterwards infant started cuing to breastfeed. Mom latched infant on her left breast using the football hold position, infant latched with depth, infant was still BF after 13 minutes when LC left the room.  Mom knows to BF infant by cues, 8 to 12= or more times within 24 hours, STS. LC discussed infant's  input and output with mom. Mom made aware of O/P services, breastfeeding support groups, community resources, and our phone # for post-discharge questions.  Maternal Data Has patient been taught Hand Expression?: Yes Does the patient have breastfeeding experience prior to this delivery?: Yes How long did the patient breastfeed?: Per mom, she breastfeed her 71 year old daughter for 3 months.  Feeding Mother's Current Feeding Choice: Breast Milk  LATCH Score Latch: Grasps breast easily, tongue down, lips flanged, rhythmical sucking.  Audible Swallowing: Spontaneous and intermittent  Type of Nipple: Everted at rest and after stimulation  Comfort (Breast/Nipple): Soft / non-tender  Hold (Positioning): Assistance needed to correctly position infant at breast and maintain latch.  LATCH Score: 9   Lactation Tools Discussed/Used    Interventions Interventions: Breast feeding basics reviewed;Assisted with latch;Skin to skin;Breast massage;Hand express;Adjust position;Support pillows;Breast compression;Position options;Expressed milk;Education  Discharge Pump: Personal WIC Program: Yes  Consult Status Consult Status: Follow-up Date: 11/14/20 Follow-up type: In-patient    Jamie Moore 11/13/2020, 9:48 PM

## 2020-11-13 NOTE — Progress Notes (Signed)
Jamie Moore is a 25 y.o. G3P1011 at [redacted]w[redacted]d admitted for IOL for IUGR.  Subjective: Feeling more intense contractions.  No concerns.  Discussed AROM and she is agreeable.   Objective: BP 117/72   Pulse (!) 109   Temp 97.7 F (36.5 C) (Oral)   Resp 19   Ht 5\' 3"  (1.6 m)   Wt 100.6 kg   LMP 02/21/2020 (Approximate)   BMI 39.27 kg/m  No intake/output data recorded.  FHT: FHR: 145 bpm, variability: moderate, accelerations: Absent, decelerations: Absent UC: Tracing poorly while standing up, but feeling contractions q2-3 min  SVE:   Dilation: 6 Effacement (%): 70 Station: -1 Exam by:: [email or web address]  Labs: Lab Results  Component Value Date   WBC 7.7 11/13/2020   HGB 11.7 (L) 11/13/2020   HCT 34.0 (L) 11/13/2020   MCV 86.1 11/13/2020   PLT 194 11/13/2020    Assessment / Plan: Jamie Moore is a 25 y.o. G3P1011 at [redacted]w[redacted]d admitted for IOL for IUGR.   #Labor: Received Cytotec x1 for unfavorable cervix.  Started pitocin once cervix favorable around 1400.  AROM this check with large amount of clear fluid. #Pain: Per pt request, not currently planning for meds or epidural #FWB: Cat I #ID: GBS PCR negative #MOF: Breast #MOC: POPs #Circ: Desired   Madailein Londo [redacted]w[redacted]d, MD 11/13/2020, 5:10 PM

## 2020-11-13 NOTE — Progress Notes (Signed)
CHRISTY EHRSAM is a 25 y.o. G3P1011 at [redacted]w[redacted]d admitted for IOL for IUGR.  Subjective: Feeling some contractions, but very tolerable. No concerns.   Objective: BP 110/72 (BP Location: Left Arm)   Pulse (!) 109   Temp 97.7 F (36.5 C) (Oral)   Resp 19   Ht 5\' 3"  (1.6 m)   Wt 100.6 kg   LMP 02/21/2020 (Approximate)   BMI 39.27 kg/m  No intake/output data recorded.  FHT: FHR: 145 bpm, variability: moderate, accelerations: Absent, decelerations: Absent UC: q2-3 min  SVE:   Dilation: 4 Effacement (%): 60 Station: -1 Exam by:: [email or web address]  Labs: Lab Results  Component Value Date   WBC 7.7 11/13/2020   HGB 11.7 (L) 11/13/2020   HCT 34.0 (L) 11/13/2020   MCV 86.1 11/13/2020   PLT 194 11/13/2020    Assessment / Plan: KATALIA CHOMA is a 25 y.o. G3P1011 at [redacted]w[redacted]d admitted for IOL for IUGR.   #Labor: Received Cytotec x1 for unfavorable cervix.  Now contracting, but not very painful, and progressing well.  Bishop score now 8.  Start pitocin #Pain: TBD per pt request #FWB: Cat I #ID: GBS PCR negative #MOF: Breast #MOC: POPs #Circ: Desired   Porchea Charrier [redacted]w[redacted]d, MD 11/13/2020, 2:21 PM

## 2020-11-14 NOTE — Progress Notes (Signed)
POSTPARTUM PROGRESS NOTE  Subjective: Jamie Moore is a 25 y.o. [id number] s/p SVD at [redacted]w[redacted]d.  She reports she doing well. No acute events overnight. She denies any problems with ambulating, voiding or po intake. Denies nausea or vomiting. Pain is well controlled.  Lochia is appropriate.  Objective: Blood pressure 104/74, pulse 100, temperature 98.5 F (36.9 C), temperature source Oral, resp. rate 18, height 5\' 3"  (1.6 m), weight 100.6 kg, last menstrual period 02/21/2020, SpO2 100 %, unknown if currently breastfeeding.  Physical Exam:  General: alert, cooperative and no distress Chest: no respiratory distress Abdomen: soft, non-tender  Uterine Fundus: firm, appropriately tender Extremities: No calf swelling or tenderness  no edema  Recent Labs    11/13/20 0800  HGB 11.7*  HCT 34.0*    Assessment/Plan: Jamie Moore is a 25 y.o. 30 s/p SVD at [redacted]w[redacted]d for IOL for IUGR.  Routine Postpartum Care: Doing well, pain well-controlled.  -Continue routine care, lactation support  -Contraception: POPs -Feeding: Breast  Late PNC:  -SW consulted  Dispo: Plan for discharge on PPD#2.  [redacted]w[redacted]d, MD 11/14/2020, 8:43 AM

## 2020-11-14 NOTE — Lactation Note (Addendum)
This note was copied from a baby's chart. Lactation Consultation Note  Patient Name: Jamie Moore Jamie Moore Date: 11/14/2020 Reason for consult: Follow-up assessment;Early term 37-38.6wks;Infant weight loss (-1% weight loss) Age:23 hours  Infant had one void and 3 stools. P2, per mom, infant is latching better today, he has started BF longer from 5 minutes to 15 minutes for the  last few feedings. Mom offered formula only once, she is focused on breastfeeding infant at this time. Mom knows after latching infant at the breast she can hand express and give extra volume of her EBM. Mom will continue to breastfeed infant according to cues, 8 to 12+ times within 24 hours, STS. LC mention infant may start cluster feeding now that he is past 24 hours. Mom doesn't have any questions or concerns for LC at this time. Maternal Data    Feeding Mother's Current Feeding Choice: Breast Milk and Formula  LATCH Score Latch: Grasps breast easily, tongue down, lips flanged, rhythmical sucking.  Audible Swallowing: A few with stimulation  Type of Nipple: Everted at rest and after stimulation  Comfort (Breast/Nipple): Soft / non-tender  Hold (Positioning): Assistance needed to correctly position infant at breast and maintain latch.  LATCH Score: 8   Lactation Tools Discussed/Used    Interventions Interventions: Skin to skin;Breast massage;Breast compression;Adjust position;Support pillows;Position options;Expressed milk;Education  Discharge    Consult Status Consult Status: Follow-up Date: 11/15/20 Follow-up type: In-patient    Danelle Earthly 11/14/2020, 7:17 PM

## 2020-11-14 NOTE — Social Work (Signed)
CSW received consult for hx of Anxiety, Depression and PTSD.  CSW met with MOB to offer support and complete assessment.     CSW introduced self and role. CSW observed infant being held by [facility]. MOB was open and pleasant speaking with CSW. CSW informed MOB of the reason for consult. MOB reported she was diagnosed with PPD, anxiety, and depression in 2019 following the birth of her first child. MOB stated she was prescribed Zoloft which she found to be helpful. MOB reported she stopped taking the medication in 2020. MOB stated she attended therapy virtually through an EAP program. MOB reported she now attends therapy as needed. MOB shared she is currently feeling pretty good, as she smiled. MOB identified her grandmother and PGM as supports. MOB denies any current SI, HI or domestic violence.   CSW provided education regarding the baby blues period vs. perinatal mood disorders, discussed treatment and provided resources for mental health follow up. CSW recommended self-evaluation using the New Mom Checklist and encouraged MOB to contact a medical professional if symptoms are noted at any time.    CSW provided review of Sudden Infant Death Syndrome (SIDS) precautions. MOB reported she has all essential needs for infant. MOB has identified a pediatrician and denies any barriers to care. MOB is currently enrolled in the [employer] program and expressed no additional needs at this time.   CSW identifies no further need for intervention and no barriers to discharge at this time.  Darra Lis, [place] [employer] [phone]

## 2020-11-15 ENCOUNTER — Encounter: Payer: Medicaid Other | Admitting: Medical

## 2020-11-15 MED ORDER — IBUPROFEN 600 MG PO TABS: 600.0000 mg | ORAL_TABLET | Freq: Four times a day (QID) | ORAL | 0 refills | Status: DC | PRN

## 2020-11-15 NOTE — Discharge Instructions (Signed)

## 2020-11-15 NOTE — Lactation Note (Signed)
This note was copied from a baby's chart. Lactation Consultation Note  Patient Name: Jamie Moore WOEHO'Z Date: 11/15/2020   Age:23 hours  Mom declined a lactation visit & said she had no questions. Per RN, infant recently fed at the breast & was supplemented with formula after.   Lurline Hare [facility] 11/15/2020, 8:58 AM

## 2020-11-16 ENCOUNTER — Telehealth: Payer: Self-pay

## 2020-11-16 NOTE — Telephone Encounter (Signed)
Transition Care Management Follow-up Telephone Call  Date of discharge and from where: 11/15/2020 from [facility]  How have you been since you were released from the hospital? Pt stated that she is have some increase pain in her pelvic area. Pt encouraged to call OBGYN  Any questions or concerns? No  Items Reviewed:  Did the pt receive and understand the discharge instructions provided? Yes   Medications obtained and verified? No   Other? No   Any new allergies since your discharge? No   Dietary orders reviewed? n/a  Do you have support at home? Yes   Functional Questionnaire: (I = Independent and D = Dependent) ADLs: I  Bathing/Dressing- I  Meal Prep- I  Eating- I  Maintaining continence- I  Transferring/Ambulation- I  Managing Meds- I  Follow up appointments reviewed:   PCP Hospital f/u appt confirmed? No    Specialist Hospital f/u appt confirmed? Yes  Encouraged pt to call as OBGYN may want to see patient sooner than 12/11/2020  Are transportation arrangements needed? No   If their condition worsens, is the pt aware to call PCP or go to the Emergency Dept.? Yes  Was the patient provided with contact information for the PCP's office or ED? Yes  Was to pt encouraged to call back with questions or concerns? Yes

## 2020-11-20 DIAGNOSIS — Z419 Encounter for procedure for purposes other than remedying health state, unspecified: Secondary | ICD-10-CM | POA: Diagnosis not present

## 2020-12-11 ENCOUNTER — Ambulatory Visit: Payer: Medicaid Other | Admitting: Obstetrics and Gynecology

## 2020-12-11 ENCOUNTER — Ambulatory Visit: Payer: Self-pay | Admitting: Obstetrics and Gynecology

## 2020-12-21 DIAGNOSIS — Z419 Encounter for procedure for purposes other than remedying health state, unspecified: Secondary | ICD-10-CM | POA: Diagnosis not present

## 2020-12-24 ENCOUNTER — Telehealth: Payer: Self-pay

## 2020-12-24 NOTE — Telephone Encounter (Signed)
Attempted to reach Ms.Dormer today to get her scheduled with the Managed Medicaid team. I left my name and number on her VM.

## 2021-01-03 ENCOUNTER — Other Ambulatory Visit: Payer: Self-pay

## 2021-01-03 ENCOUNTER — Ambulatory Visit (INDEPENDENT_AMBULATORY_CARE_PROVIDER_SITE_OTHER): Payer: 59 | Admitting: Student

## 2021-01-03 ENCOUNTER — Other Ambulatory Visit (HOSPITAL_COMMUNITY)
Admission: RE | Admit: 2021-01-03 | Discharge: 2021-01-03 | Disposition: A | Discharge status: Home / Self Care | Payer: 59 | Source: Ambulatory Visit | Attending: Obstetrics and Gynecology | Admitting: Obstetrics and Gynecology

## 2021-01-03 ENCOUNTER — Encounter: Payer: Self-pay | Admitting: Student

## 2021-01-03 VITALS — BP 120/88 | HR 101 | Ht 63.0 in | Wt 205.0 lb

## 2021-01-03 DIAGNOSIS — N898 Other specified noninflammatory disorders of vagina: Secondary | ICD-10-CM | POA: Diagnosis not present

## 2021-01-03 NOTE — Progress Notes (Signed)
Post Partum Visit Note  Jamie Moore is a 25 y.o. [id number] female who presents for a postpartum visit. She is 7 weeks postpartum following a normal spontaneous vaginal delivery.  I have fully reviewed the prenatal and intrapartum course. The delivery was at [redacted]w[redacted]d gestational weeks.  Anesthesia: none. Postpartum course has been uneventful. Baby is doing well. Baby is feeding by bottle - Gerber Gentle. Bleeding staining only. Bowel function is normal. Bladder function is normal. Patient is sexually active. Contraception method is none. Postpartum depression screening: negative. She reports that she has noticed a "bad" smelling discharge; it is a not a lot but it is "enough to concern me". She wants to be checked for STIs. She is sexually active.   The pregnancy intention screening data noted above was reviewed. Potential methods of contraception were discussed. The patient elected to proceed with Hormonal Implant-will make appt in one week. Inocente Salles Postnatal Depression Scale - 01/03/21 1608      Edinburgh Postnatal Depression Scale:  In the Past 7 Days   I have been able to laugh and see the funny side of things. 0    I have looked forward with enjoyment to things. 0    I have blamed myself unnecessarily when things went wrong. 0    I have been anxious or worried for no good reason. 0    I have felt scared or panicky for no good reason. 0    Things have been getting on top of me. 0    I have been so unhappy that I have had difficulty sleeping. 0    I have felt sad or miserable. 0    I have been so unhappy that I have been crying. 0    The thought of harming myself has occurred to me. 0    Edinburgh Postnatal Depression Scale Total 0           Health Maintenance Due  Topic Date Due  . COVID-19 Vaccine (1) Never done  . HPV VACCINES (1 - 2-dose series) Never done    The following portions of the patient's history were reviewed and updated as appropriate: allergies,  current medications, past family history, past medical history, past social history, past surgical history and problem list.  Review of Systems Pertinent items are noted in HPI.  Objective:  BP 120/88   Pulse (!) 101   Ht 5\' 3"  (1.6 m)   Wt 205 lb (93 kg)   LMP 02/21/2020 (Approximate)   Breastfeeding No   BMI 36.31 kg/m    General:  alert, cooperative and no distress   Breasts:  normal  Lungs: clear to auscultation bilaterally  Heart:  regular rate and rhythm, S1, S2 normal, no murmur, click, rub or gallop  Abdomen: soft, non-tender; bowel sounds normal; no masses,  no organomegaly   Wound {  GU exam:  not indicated       Assessment:    There are no diagnoses linked to this encounter.  Healthy postpartum exam.   Plan:   Essential components of care per ACOG recommendations:  1.  Mood and well being: Patient with positive depression screening today. Reviewed local resources for support.  - Patient does not use tobacco. - hx of drug use? No .   2. Infant care and feeding:  -Patient currently breastmilk feeding? No  -Social determinants of health (SDOH) reviewed in EPIC. No concerns  3. Sexuality, contraception and birth spacing - Patient  does not want a pregnancy in the next year.  Desired family size is 2 children.  - Reviewed forms of contraception in tiered fashion. Patient desired  Nexplanon; will do in one week.    - Discussed birth spacing of 18 months -wet prep ordered; patient self-collected  4. Sleep and fatigue -Encouraged family/partner/community support of 4 hrs of uninterrupted sleep to help with mood and fatigue  5. Physical Recovery  - Discussed patients delivery  and complications - Patient had a Vaginal, no problems at delivery. Perineal healing reviewed. Patient expressed understanding. Patient denies pain.  - Patient has urinary incontinence? No Patient was referred to pelvic floor PT  - Patient is safe to resume physical and sexual activity.  Patient is already sexually active; advised patient to wait another week before inserting nexplanon; use barrier methods in the meantime. Plan for nexplanon insertion in one week.   6.  Health Maintenance - HM due items addressed No - none - Last pap smear done 01/2019 and was normal with negative HPV. Pap smear not done at today's visit.  Mammogram due NA  7. Chronic Disease  - PCP follow up  Marylene Land, CNM Center for [facility], [facility]

## 2021-01-04 ENCOUNTER — Other Ambulatory Visit: Payer: Self-pay | Admitting: Student

## 2021-01-04 LAB — CERVICOVAGINAL ANCILLARY ONLY
Bacterial Vaginitis (gardnerella): POSITIVE — AB
Candida Glabrata: NEGATIVE
Candida Vaginitis: NEGATIVE
Chlamydia: NEGATIVE
Comment: NEGATIVE
Comment: NEGATIVE
Comment: NEGATIVE
Comment: NEGATIVE
Comment: NEGATIVE
Comment: NORMAL
Neisseria Gonorrhea: NEGATIVE
Trichomonas: NEGATIVE

## 2021-01-04 MED ORDER — METRONIDAZOLE 500 MG PO TABS: 500.0000 mg | ORAL_TABLET | Freq: Two times a day (BID) | ORAL | 0 refills | Status: DC

## 2021-01-11 ENCOUNTER — Telehealth: Payer: Self-pay

## 2021-01-11 ENCOUNTER — Ambulatory Visit: Payer: Medicaid Other | Admitting: Obstetrics and Gynecology

## 2021-01-11 NOTE — Telephone Encounter (Signed)
Called pt to follow up on missed appt for Nexplanon insertion. Call cannot be completed at this time. MyChart message sent.

## 2021-01-20 DIAGNOSIS — Z419 Encounter for procedure for purposes other than remedying health state, unspecified: Secondary | ICD-10-CM | POA: Diagnosis not present

## 2021-02-20 DIAGNOSIS — Z419 Encounter for procedure for purposes other than remedying health state, unspecified: Secondary | ICD-10-CM | POA: Diagnosis not present

## 2021-02-25 ENCOUNTER — Telehealth: Payer: Self-pay

## 2021-02-25 NOTE — Telephone Encounter (Signed)
..   Medicaid Managed Care   Unsuccessful Outreach Note  02/25/2021 Name: Jamie Moore MRN: [id number] DOB: 01-Dec-1995  Referred by: Patient, No Pcp Per (Inactive) Reason for referral : High Risk Managed Medicaid (Attempted to reach Ms.Mungin today to get her scheduled with the [facility] Team for a phone visit. No one answered and there was not a VM.)   A second unsuccessful telephone outreach was attempted today. The patient was referred to the case management team for assistance with care management and care coordination.   Follow Up Plan: The care management team will reach out to the patient again over the next 5-7 days.   Weston Settle Care Guide, High Risk Medicaid Managed Care Embedded Care Coordination [facility]  [facility]

## 2021-02-28 ENCOUNTER — Other Ambulatory Visit: Payer: Self-pay

## 2021-02-28 ENCOUNTER — Telehealth (INDEPENDENT_AMBULATORY_CARE_PROVIDER_SITE_OTHER): Payer: 59 | Admitting: Obstetrics and Gynecology

## 2021-02-28 ENCOUNTER — Encounter: Payer: Self-pay | Admitting: Obstetrics and Gynecology

## 2021-02-28 DIAGNOSIS — Z30011 Encounter for initial prescription of contraceptive pills: Secondary | ICD-10-CM | POA: Diagnosis not present

## 2021-02-28 DIAGNOSIS — Z3009 Encounter for other general counseling and advice on contraception: Secondary | ICD-10-CM | POA: Insufficient documentation

## 2021-02-28 MED ORDER — NORGESTIMATE-ETH ESTRADIOL 0.25-35 MG-MCG PO TABS
1.0000 | ORAL_TABLET | Freq: Every day | ORAL | 11 refills | Status: DC
Start: 2021-02-28 — End: 2022-03-13

## 2021-02-28 NOTE — Progress Notes (Signed)
Patient stated that she would like to started birth control pills

## 2021-02-28 NOTE — Progress Notes (Signed)
I connected with  Jamie Moore on 02/28/21 at  8:55 AM EDT by MyChart Virtual Video Visit and verified that I am speaking with the correct person using two identifiers.   I discussed the limitations, risks, security and privacy concerns of performing an evaluation and management service by telephone and the availability of in person appointments. I also discussed with the patient that there may be a patient responsible charge related to this service. The patient expressed understanding and agreed to proceed.  Guy Begin, CMA 02/28/2021  8:56 AM

## 2021-02-28 NOTE — Progress Notes (Signed)
    GYNECOLOGY VIRTUAL VISIT ENCOUNTER NOTE  Provider location: Center for [facility]   Patient location: Home  I connected with Jamie Moore on 02/28/21 at  8:55 AM EDT by MyChart Video Encounter and verified that I am speaking with the correct person using two identifiers.   I discussed the limitations, risks, security and privacy concerns of performing an evaluation and management service virtually and the availability of in person appointments. I also discussed with the patient that there may be a patient responsible charge related to this service. The patient expressed understanding and agreed to proceed.   History:  Jamie Moore is a 25 y.o. [phone] female being evaluated today for birth control consult. She denies any abnormal vaginal discharge, bleeding, pelvic pain or other concerns.  She recently delivered 3 months ago.  The patient is not breast feeding.  She has not had unprotected intercourse for at least a month.  Pt initially wanted a nexplanon, but has changed her mind.  Pt has used sprinted before and would like to restart.  Pt did get pregnant on OCP but acknowledged she had missed pills in the past.Pap is current and up to date.     Past Medical History:  Diagnosis Date   Anxiety    Headache    Past Surgical History:  Procedure Laterality Date   NO PAST SURGERIES     The following portions of the patient's history were reviewed and updated as appropriate: allergies, current medications, past family history, past medical history, past social history, past surgical history and problem list.   Health Maintenance:  Normal pap and negative HRHPV on 02/10/19.     Review of Systems:  Pertinent items noted in HPI and remainder of comprehensive ROS otherwise negative.  Physical Exam:   General:  Alert, oriented and cooperative. Patient appears to be in no acute distress.  Mental Status: Normal mood and affect. Normal behavior.  Normal judgment and thought content.   Respiratory: Normal respiratory effort, no problems with respiration noted  Rest of physical exam deferred due to type of encounter  Labs and Imaging No results found for this or any previous visit (from the past 336 hour(s)). No results found.     Assessment and Plan:     1. Birth control counseling Pt advised to start on first day of next cycle or the Sunday following. - norgestimate-ethinyl estradiol (ORTHO-CYCLEN) 0.25-35 MG-MCG tablet; Take 1 tablet by mouth daily.  Dispense: 28 tablet; Refill: 11  2. Encounter for BCP (birth control pills) initial prescription  - norgestimate-ethinyl estradiol (ORTHO-CYCLEN) 0.25-35 MG-MCG tablet; Take 1 tablet by mouth daily.  Dispense: 28 tablet; Refill: 11       I discussed the assessment and treatment plan with the patient. The patient was provided an opportunity to ask questions and all were answered. The patient agreed with the plan and demonstrated an understanding of the instructions.   The patient was advised to call back or seek an in-person evaluation/go to the ED if the symptoms worsen or if the condition fails to improve as anticipated.  I spent 10 minutes dedicated to the care of this patient including previsit review of records, face to face time with the patient discussing options and post visit testing.    F/u in 3 months for BP check with nurse visit  AE with pap on or around 01/2022  Warden Fillers, MD Center for [facility], [facility]

## 2021-03-22 DIAGNOSIS — Z419 Encounter for procedure for purposes other than remedying health state, unspecified: Secondary | ICD-10-CM | POA: Diagnosis not present

## 2021-04-06 ENCOUNTER — Emergency Department (HOSPITAL_BASED_OUTPATIENT_CLINIC_OR_DEPARTMENT_OTHER): Payer: 59

## 2021-04-06 ENCOUNTER — Emergency Department (HOSPITAL_BASED_OUTPATIENT_CLINIC_OR_DEPARTMENT_OTHER)
Admission: EM | Admit: 2021-04-06 | Discharge: 2021-04-06 | Disposition: A | Discharge status: Home / Self Care | Payer: 59 | Attending: Emergency Medicine | Admitting: Emergency Medicine

## 2021-04-06 ENCOUNTER — Encounter (HOSPITAL_BASED_OUTPATIENT_CLINIC_OR_DEPARTMENT_OTHER): Payer: Self-pay | Admitting: Emergency Medicine

## 2021-04-06 ENCOUNTER — Other Ambulatory Visit: Payer: Self-pay

## 2021-04-06 DIAGNOSIS — S01511A Laceration without foreign body of lip, initial encounter: Secondary | ICD-10-CM | POA: Diagnosis not present

## 2021-04-06 DIAGNOSIS — S0993XA Unspecified injury of face, initial encounter: Secondary | ICD-10-CM | POA: Diagnosis present

## 2021-04-06 DIAGNOSIS — S5011XA Contusion of right forearm, initial encounter: Secondary | ICD-10-CM | POA: Diagnosis not present

## 2021-04-06 DIAGNOSIS — Y92838 Other recreation area as the place of occurrence of the external cause: Secondary | ICD-10-CM | POA: Diagnosis not present

## 2021-04-06 MED ORDER — OXYCODONE-ACETAMINOPHEN 5-325 MG PO TABS
1.0000 | ORAL_TABLET | Freq: Once | ORAL | Status: AC
  Administered 2021-04-06: 1 via ORAL
  Filled 2021-04-06: qty 1

## 2021-04-06 MED ORDER — LIDOCAINE-EPINEPHRINE-TETRACAINE (LET) TOPICAL GEL
3.0000 mL | Freq: Once | TOPICAL | Status: AC
  Administered 2021-04-06: 3 mL via TOPICAL
  Filled 2021-04-06: qty 3

## 2021-04-06 MED ORDER — LIDOCAINE-EPINEPHRINE 2 %-1:100000 IJ SOLN
20.0000 mL | Freq: Once | INTRAMUSCULAR | Status: AC
  Administered 2021-04-06: 20 mL
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
## 2021-04-06 NOTE — ED Notes (Signed)
Patient transported to CT 

## 2021-04-08 ENCOUNTER — Telehealth: Payer: Self-pay

## 2021-04-08 NOTE — Telephone Encounter (Signed)
Transition Care Management Unsuccessful Follow-up Telephone Call  Date of discharge and from where:  04/06/2021-[facility]  Attempts:  1st Attempt  Reason for unsuccessful TCM follow-up call:  Unable to reach patient

## 2021-04-09 NOTE — Telephone Encounter (Signed)
Transition Care Management Unsuccessful Follow-up Telephone Call  Date of discharge and from where:  04/06/2021-[facility]  Attempts:  2nd Attempt  Reason for unsuccessful TCM follow-up call:  Unable to reach patient

## 2021-04-10 NOTE — Telephone Encounter (Signed)
Transition Care Management Unsuccessful Follow-up Telephone Call  Date of discharge and from where:  04/06/2021-[facility]   Attempts:  3rd Attempt  Reason for unsuccessful TCM follow-up call:  Unable to reach patient

## 2021-04-22 DIAGNOSIS — Z419 Encounter for procedure for purposes other than remedying health state, unspecified: Secondary | ICD-10-CM | POA: Diagnosis not present

## 2021-05-15 ENCOUNTER — Ambulatory Visit: Payer: 59

## 2021-05-23 DIAGNOSIS — Z419 Encounter for procedure for purposes other than remedying health state, unspecified: Secondary | ICD-10-CM | POA: Diagnosis not present

## 2021-06-22 DIAGNOSIS — Z419 Encounter for procedure for purposes other than remedying health state, unspecified: Secondary | ICD-10-CM | POA: Diagnosis not present

## 2021-07-23 DIAGNOSIS — Z419 Encounter for procedure for purposes other than remedying health state, unspecified: Secondary | ICD-10-CM | POA: Diagnosis not present

## 2021-08-22 ENCOUNTER — Ambulatory Visit (INDEPENDENT_AMBULATORY_CARE_PROVIDER_SITE_OTHER): Payer: 59 | Admitting: General Practice

## 2021-08-22 ENCOUNTER — Other Ambulatory Visit (HOSPITAL_COMMUNITY)
Admission: RE | Admit: 2021-08-22 | Discharge: 2021-08-22 | Disposition: A | Discharge status: Home / Self Care | Payer: 59 | Source: Ambulatory Visit | Attending: Family Medicine | Admitting: Family Medicine

## 2021-08-22 ENCOUNTER — Other Ambulatory Visit: Payer: Self-pay

## 2021-08-22 VITALS — BP 121/88 | HR 95 | Ht 63.0 in | Wt 220.0 lb

## 2021-08-22 DIAGNOSIS — Z113 Encounter for screening for infections with a predominantly sexual mode of transmission: Secondary | ICD-10-CM | POA: Diagnosis not present

## 2021-08-22 DIAGNOSIS — Z3202 Encounter for pregnancy test, result negative: Secondary | ICD-10-CM

## 2021-08-22 DIAGNOSIS — Z419 Encounter for procedure for purposes other than remedying health state, unspecified: Secondary | ICD-10-CM | POA: Diagnosis not present

## 2021-08-22 DIAGNOSIS — N926 Irregular menstruation, unspecified: Secondary | ICD-10-CM

## 2021-08-22 LAB — POCT PREGNANCY, URINE: Preg Test, Ur: NEGATIVE

## 2021-08-22 NOTE — Progress Notes (Signed)
Chart reviewed for nurse visit. Agree with plan of care.   Marylene Land, CNM 08/22/2021 1:37 PM

## 2021-08-22 NOTE — Progress Notes (Signed)
Patient presents to office today requesting pregnancy test and STD testing. LMP around 10/20. She reports a day of some bleeding recently but that was it. Patient is intermittently taking OCPs- desires something different for birth control. UPT -. Recommended patient take OCPs daily until appt in office to change to different birth control. Patient instructed in self swab & specimen collected. Advised results will be back in 24-48 hours and available via mychart.  Chase Caller RN BSN 08/22/21

## 2021-08-23 LAB — CERVICOVAGINAL ANCILLARY ONLY
Chlamydia: NEGATIVE
Comment: NEGATIVE
Comment: NEGATIVE
Comment: NORMAL
Neisseria Gonorrhea: NEGATIVE
Trichomonas: NEGATIVE
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
## 2021-09-22 DIAGNOSIS — Z419 Encounter for procedure for purposes other than remedying health state, unspecified: Secondary | ICD-10-CM | POA: Diagnosis not present

## 2021-09-22 DIAGNOSIS — A749 Chlamydial infection, unspecified: Secondary | ICD-10-CM

## 2021-09-22 HISTORY — DX: Chlamydial infection, unspecified: A74.9

## 2021-10-23 DIAGNOSIS — Z419 Encounter for procedure for purposes other than remedying health state, unspecified: Secondary | ICD-10-CM | POA: Diagnosis not present

## 2021-10-29 ENCOUNTER — Ambulatory Visit: Payer: 59

## 2021-11-20 DIAGNOSIS — Z419 Encounter for procedure for purposes other than remedying health state, unspecified: Secondary | ICD-10-CM | POA: Diagnosis not present

## 2021-11-21 DIAGNOSIS — N760 Acute vaginitis: Secondary | ICD-10-CM | POA: Diagnosis not present

## 2021-11-21 DIAGNOSIS — Z113 Encounter for screening for infections with a predominantly sexual mode of transmission: Secondary | ICD-10-CM | POA: Diagnosis not present

## 2021-12-14 ENCOUNTER — Encounter (HOSPITAL_COMMUNITY): Payer: Self-pay

## 2021-12-14 ENCOUNTER — Ambulatory Visit (HOSPITAL_COMMUNITY)
Admission: EM | Admit: 2021-12-14 | Discharge: 2021-12-14 | Disposition: A | Discharge status: Home / Self Care | Payer: 59 | Attending: Nurse Practitioner | Admitting: Nurse Practitioner

## 2021-12-14 ENCOUNTER — Other Ambulatory Visit: Payer: Self-pay

## 2021-12-14 DIAGNOSIS — R058 Other specified cough: Secondary | ICD-10-CM | POA: Diagnosis not present

## 2021-12-14 MED ORDER — BENZONATATE 100 MG PO CAPS: 100.0000 mg | ORAL_CAPSULE | Freq: Three times a day (TID) | ORAL | 0 refills | Status: DC | PRN

## 2021-12-14 MED ORDER — PROMETHAZINE-DM 6.25-15 MG/5ML PO SYRP: 5.0000 mL | ORAL_SOLUTION | Freq: Four times a day (QID) | ORAL | 0 refills | Status: DC | PRN

## 2021-12-14 NOTE — Discharge Instructions (Addendum)
-   You can use the cough Perles alternating with the cough syrup for the dry cough; do not take them at the same time ?-Please also start nasal saline rinses to help with the postnasal drip ?-If you start developing fevers, chills, decreased appetite, increased fatigue, please seek care ?

## 2021-12-14 NOTE — ED Triage Notes (Signed)
2 week h/o non productive cough and onset today of throat pain from coughing. Coughing spells interfere with her sleep. At the onset cough was productive. Denies sore throat and pain w/swallowing. ?Has been taking Nyquil. No v/d. ? ?

## 2021-12-14 NOTE — ED Provider Notes (Signed)
?[facility] ? ? ? ?CSN: [id number] ?Arrival date & time: 12/14/21  1521 ? ? ?  ? ?History   ?Chief Complaint ?Chief Complaint  ?Patient presents with  ? Cough  ? ? ?HPI ?Jamie Moore is a 26 y.o. female.  ? ?Patient reports 2 week history of cough.  Reports initially, the cough was congested and she could bring stuff up.  Now the cough is dry and she feels like she is coughing in spasms.  She denies fevers, naseau/vomtiing, chills,  ? ? ?Past Medical History:  ?Diagnosis Date  ? Anxiety   ? Headache   ? ? ?Patient Active Problem List  ? Diagnosis Date Noted  ? Birth control counseling 02/28/2021  ? Encounter for BCP (birth control pills) initial prescription 02/28/2021  ? Late prenatal care affecting pregnancy in second trimester 07/10/2020  ? IUGR (intrauterine growth restriction) in prior pregnancy, pregnant, second trimester 07/10/2020  ? Chlamydia infection 10/03/2018  ? Posttraumatic stress disorder   ? MDD (major depressive disorder), severe (HCC) 08/09/2018  ? ? ?Past Surgical History:  ?Procedure Laterality Date  ? NO PAST SURGERIES    ? ? ?OB History   ? ? Gravida  ?3  ? Para  ?2  ? Term  ?2  ? Preterm  ?   ? AB  ?1  ? Living  ?2  ?  ? ? SAB  ?1  ? IAB  ?   ? Ectopic  ?   ? Multiple  ?0  ? Live Births  ?2  ?   ?  ?  ? ? ? ?Home Medications   ? ?Prior to Admission medications   ?Medication Sig Start Date End Date Taking? Authorizing Provider  ?benzonatate (TESSALON) 100 MG capsule Take 1 capsule (100 mg total) by mouth 3 (three) times daily as needed for cough. Do not take while driving or operating heavy machinery 12/14/21  Yes Valentino NoseMartinez, Kallen Mccrystal A, NP  ?promethazine-dextromethorphan (PROMETHAZINE-DM) 6.25-15 MG/5ML syrup Take 5 mLs by mouth 4 (four) times daily as needed for cough. Do not take while driving or operating heavy machinery 12/14/21  Yes Valentino NoseMartinez, Marti Acebo A, NP  ?norgestimate-ethinyl estradiol (ORTHO-CYCLEN) 0.25-35 MG-MCG tablet Take 1 tablet by mouth daily. ?Patient not taking:  Reported on 08/22/2021 02/28/21   Warden FillersBass, Lawrence A, MD  ?sertraline (ZOLOFT) 50 MG tablet Take 1 tablet (50 mg total) by mouth daily. For depression ?Patient not taking: Reported on 02/10/2019 08/13/18 11/17/19  Armandina StammerNwoko, Agnes I, NP  ?traZODone (DESYREL) 50 MG tablet Take 1 tablet (50 mg total) by mouth at bedtime as needed for sleep. ?Patient not taking: Reported on 12/02/2018 08/12/18 11/17/19  Armandina StammerNwoko, Agnes I, NP  ? ? ?Family History ?Family History  ?Problem Relation Age of Onset  ? Hypertension Mother   ? Hypertension Father   ? Diabetes Maternal Grandmother   ? Diabetes Paternal Grandmother   ? ? ?Social History ?Social History  ? ?Tobacco Use  ? Smoking status: Never  ? Smokeless tobacco: Never  ?Vaping Use  ? Vaping Use: Never used  ?Substance Use Topics  ? Alcohol use: Yes  ?  Comment: social  ? Drug use: No  ? ? ? ?Allergies   ?Patient has no known allergies. ? ? ?Review of Systems ?Review of Systems ?Per HPI ? ?Physical Exam ?Triage Vital Signs ?ED Triage Vitals  ?Enc Vitals Group  ?   BP 12/14/21 1627 119/88  ?   Pulse Rate 12/14/21 1627 (!) 118  ?  Resp 12/14/21 1627 18  ?   Temp 12/14/21 1627 98.8 ?F (37.1 ?C)  ?   Temp Source 12/14/21 1627 Oral  ?   SpO2 12/14/21 1627 98 %  ?   Weight --   ?   Height --   ?   Head Circumference --   ?   Peak Flow --   ?   Pain Score 12/14/21 1625 7  ?   Pain Loc --   ?   Pain Edu? --   ?   Excl. in GC? --   ? ?No data found. ? ?Updated Vital Signs ?BP 119/88 (BP Location: Left Arm)   Pulse (!) 118   Temp 98.8 ?F (37.1 ?C) (Oral)   Resp 18   SpO2 98%   Breastfeeding No  ? ?Visual Acuity ?Right Eye Distance:   ?Left Eye Distance:   ?Bilateral Distance:   ? ?Right Eye Near:   ?Left Eye Near:    ?Bilateral Near:    ? ?Physical Exam ?Vitals and nursing note reviewed.  ?Constitutional:   ?   General: She is not in acute distress. ?   Appearance: Normal appearance. She is not ill-appearing or toxic-appearing.  ?HENT:  ?   Head: Normocephalic and atraumatic.  ?   Right Ear:  Tympanic membrane, ear canal and external ear normal.  ?   Left Ear: Tympanic membrane, ear canal and external ear normal.  ?   Nose: No congestion or rhinorrhea.  ?   Mouth/Throat:  ?   Mouth: Mucous membranes are moist.  ?   Pharynx: Oropharynx is clear. Posterior oropharyngeal erythema present. No oropharyngeal exudate.  ?Eyes:  ?   General: No scleral icterus. ?   Extraocular Movements: Extraocular movements intact.  ?Cardiovascular:  ?   Rate and Rhythm: Regular rhythm. Tachycardia present.  ?Pulmonary:  ?   Effort: Pulmonary effort is normal. No respiratory distress.  ?   Breath sounds: Normal breath sounds. No wheezing, rhonchi or rales.  ?Abdominal:  ?   General: Abdomen is flat. Bowel sounds are normal. There is no distension.  ?   Palpations: Abdomen is soft.  ?Musculoskeletal:  ?   Cervical back: Normal range of motion and neck supple.  ?Lymphadenopathy:  ?   Cervical: No cervical adenopathy.  ?Skin: ?   General: Skin is warm and dry.  ?   Coloration: Skin is not jaundiced or pale.  ?   Findings: No erythema or rash.  ?Neurological:  ?   Mental Status: She is alert and oriented to person, place, and time.  ?   Motor: No weakness.  ?Psychiatric:     ?   Mood and Affect: Mood normal.     ?   Behavior: Behavior normal.  ? ? ? ?UC Treatments / Results  ?Labs ?(all labs ordered are listed, but only abnormal results are displayed) ?Labs Reviewed - No data to display ? ?EKG ? ? ?Radiology ?No results found. ? ?Procedures ?Procedures (including critical care time) ? ?Medications Ordered in UC ?Medications - No data to display ? ?Initial Impression / Assessment and Plan / UC Course  ?I have reviewed the triage vital signs and the nursing notes. ? ?Pertinent labs & imaging results that were available during my care of the patient were reviewed by me and considered in my medical decision making (see chart for details). ? ?  ?Suspect postviral cough.  Treat with cough suppressant, nasal saline rinses.  No signs or  symptoms of  pneumonia-chest x-ray deferred today.  Follow-up if symptoms worsen. ?Final Clinical Impressions(s) / UC Diagnoses  ? ?Final diagnoses:  ?Post-viral cough syndrome  ? ? ? ?Discharge Instructions   ? ?  ?- You can use the cough Perles alternating with the cough syrup for the dry cough; do not take them at the same time ?-Please also start nasal saline rinses to help with the postnasal drip ?-If you start developing fevers, chills, decreased appetite, increased fatigue, please seek care ? ? ? ? ?ED Prescriptions   ? ? Medication Sig Dispense Auth. Provider  ? benzonatate (TESSALON) 100 MG capsule Take 1 capsule (100 mg total) by mouth 3 (three) times daily as needed for cough. Do not take while driving or operating heavy machinery 21 capsule Cathlean Marseilles A, NP  ? promethazine-dextromethorphan (PROMETHAZINE-DM) 6.25-15 MG/5ML syrup Take 5 mLs by mouth 4 (four) times daily as needed for cough. Do not take while driving or operating heavy machinery 118 mL Valentino Nose, NP  ? ?  ? ?PDMP not reviewed this encounter. ?  ?Valentino Nose, NP ?12/14/21 1730 ? ?

## 2021-12-21 DIAGNOSIS — Z419 Encounter for procedure for purposes other than remedying health state, unspecified: Secondary | ICD-10-CM | POA: Diagnosis not present

## 2022-01-18 ENCOUNTER — Emergency Department (HOSPITAL_BASED_OUTPATIENT_CLINIC_OR_DEPARTMENT_OTHER)
Admission: EM | Admit: 2022-01-18 | Discharge: 2022-01-18 | Disposition: A | Discharge status: Home / Self Care | Payer: 59 | Attending: Emergency Medicine | Admitting: Emergency Medicine

## 2022-01-18 ENCOUNTER — Other Ambulatory Visit: Payer: Self-pay

## 2022-01-18 ENCOUNTER — Encounter (HOSPITAL_BASED_OUTPATIENT_CLINIC_OR_DEPARTMENT_OTHER): Payer: Self-pay | Admitting: Emergency Medicine

## 2022-01-18 DIAGNOSIS — R102 Pelvic and perineal pain: Secondary | ICD-10-CM | POA: Diagnosis present

## 2022-01-18 LAB — URINALYSIS, ROUTINE W REFLEX MICROSCOPIC
Bilirubin Urine: NEGATIVE
Glucose, UA: NEGATIVE mg/dL
Hgb urine dipstick: NEGATIVE
Ketones, ur: NEGATIVE mg/dL
Leukocytes,Ua: NEGATIVE
Nitrite: NEGATIVE
Protein, ur: NEGATIVE mg/dL
Specific Gravity, Urine: 1.025 (ref 1.005–1.030)
pH: 6 (ref 5.0–8.0)

## 2022-01-18 LAB — WET PREP, GENITAL
Sperm: NONE SEEN
Trich, Wet Prep: NONE SEEN
WBC, Wet Prep HPF POC: <10 (ref <10)
Yeast Wet Prep HPF POC: NONE SEEN

## 2022-01-18 LAB — PREGNANCY, URINE: Preg Test, Ur: NEGATIVE

## 2022-01-18 NOTE — ED Triage Notes (Signed)
Patient arrived via POV c/o pelvic pain x 2 weeks, worsening the last 3 days. Patient also states being 15 days late on cycle. Patient is AO x 4, VS WDL, normal gait. ?

## 2022-01-18 NOTE — ED Provider Notes (Signed)
Was over ?[facility] EM[facility]ENC[facility]PART[facility] ?Provider Note ? ? ?CSN: [id number] ?Arrival date & time: 01/18/22  0231 ? ?  ? ?History ? ?Chief Complaint  ?Patient presents with  ? Pelvic Pain  ? ? ?Jamie Moore is a 26 y.o. female. ? ?The history is provided by the patient.  ?Pelvic Pain ?This is a new problem. The current episode started more than 1 week ago. The problem occurs daily. The problem has been gradually worsening. Associated symptoms include abdominal pain. Nothing aggravates the symptoms. Nothing relieves the symptoms.  ?Patient reports her last sexual contact was a month ago. ?She had her period soon after but since then she has had pelvic pain ?She also reports she has missed her most recent menstrual cycle ?No fever/vomiting ?No dysuria ?She tried to see her OB but was told no appointments until 5/24 ? ?  ? ?Home Medications ?Prior to Admission medications   ?Medication Sig Start Date End Date Taking? Authorizing Provider  ?norgestimate-ethinyl estradiol (ORTHO-CYCLEN) 0.25-35 MG-MCG tablet Take 1 tablet by mouth daily. ?Patient not taking: Reported on 08/22/2021 02/28/21   Warden Fillers, MD  ?sertraline (ZOLOFT) 50 MG tablet Take 1 tablet (50 mg total) by mouth daily. For depression ?Patient not taking: Reported on 02/10/2019 08/13/18 11/17/19  Armandina Stammer I, NP  ?traZODone (DESYREL) 50 MG tablet Take 1 tablet (50 mg total) by mouth at bedtime as needed for sleep. ?Patient not taking: Reported on 12/02/2018 08/12/18 11/17/19  Sanjuana Kava, NP  ?   ? ?Allergies    ?Patient has no known allergies.   ? ?Review of Systems   ?Review of Systems  ?Gastrointestinal:  Positive for abdominal pain.  ?Genitourinary:  Positive for pelvic pain.  ? ?Physical Exam ?Updated Vital Signs ?BP 122/85 (BP Location: Right Arm)   Pulse 97   Temp 98.1 ?F (36.7 ?C) (Oral)   Resp 18   Ht 1.6 m (5\' 3" )   Wt 95.3 kg   LMP 12/03/2021 (Exact Date)   SpO2 100%   BMI 37.20 kg/m?  ?Physical Exam ?CONSTITUTIONAL:  Well developed/well nourished ?HEAD: Normocephalic/atraumatic ?EYES: EOMI ?ENMT: Mucous membranes moist ?NECK: supple no meningeal signs ?CV: S1/S2 noted, no murmurs/rubs/gallops noted ?LUNGS: Lungs are clear to auscultation bilaterally, no apparent distress ?ABDOMEN: soft, nontender, no rebound or guarding, bowel sounds noted throughout abdomen ?GU:no cva tenderness ?Pelvic exam performed with nurse present-mild CMT, no vaginal bleeding.  No adnexal tenderness or mass.  Copious white discharge noted ?NEURO: Pt is awake/alert/appropriate, moves all extremitiesx4.  No facial droop.   ?EXTREMITIES: full ROM ?SKIN: warm, color normal ?PSYCH: no abnormalities of mood noted, alert and oriented to situation ? ?ED Results / Procedures / Treatments   ?Labs ?(all labs ordered are listed, but only abnormal results are displayed) ?Labs Reviewed  ?WET PREP, GENITAL - Abnormal; Notable for the following components:  ?    Result Value  ? Clue Cells Wet Prep HPF POC PRESENT (*)   ? All other components within normal limits  ?URINALYSIS, ROUTINE W REFLEX MICROSCOPIC  ?PREGNANCY, URINE  ?GC/CHLAMYDIA PROBE AMP ([facility]) NOT AT [facility]  ? ? ?EKG ?None ? ?Radiology ?No results found. ? ?Procedures ?Procedures  ? ? ?Medications Ordered in ED ?Medications - No data to display ? ?ED Course/ Medical Decision Making/ A&P ?  ?                        ?Medical Decision Making ?Amount and/or Complexity  of Data Reviewed ?Labs: ordered. ? ? ?Patient presents with pelvic pain for weeks.  She is in no acute distress.  No focal tenderness on exam. ?Patient is not pregnant.  She is safe for discharge home and outpatient management. ?I have low suspicion for ovarian torsion/TOA/acute abdominal emergency at this time ? ? ? ? ? ? ? ?Final Clinical Impression(s) / ED Diagnoses ?Final diagnoses:  ?Pelvic pain in female  ? ? ?Rx / DC Orders ?ED Discharge Orders   ? ? None  ? ?  ? ? ?  ?Zadie Rhine, MD ?01/18/22 [phone] ? ?

## 2022-01-20 ENCOUNTER — Telehealth: Payer: Self-pay | Admitting: *Deleted

## 2022-01-20 DIAGNOSIS — Z419 Encounter for procedure for purposes other than remedying health state, unspecified: Secondary | ICD-10-CM | POA: Diagnosis not present

## 2022-01-20 NOTE — Telephone Encounter (Signed)
Transition Care Management Follow-up Telephone Call ?Date of discharge and from where: 01/18/2022 - [facility] ?How have you been since you were released from the hospital? "Fine" ? ?Any questions or concerns? No ? ?Items Reviewed: ?Did the pt receive and understand the discharge instructions provided? Yes  ?Medications obtained and verified?  N/A ?Other? No  ?Any new allergies since your discharge? No  ?Dietary orders reviewed? No ?Do you have support at home? Yes  ? ? ?Functional Questionnaire: (I = Independent and D = Dependent) ?ADLs: I ? ?Bathing/Dressing- I ? ?Meal Prep- I ? ?Eating- I ? ?Maintaining continence- I ? ?Transferring/Ambulation- I ? ?Managing Meds- I ? ?Follow up appointments reviewed: ? ?PCP Hospital f/u appt confirmed? No  - doesn't have one and states that she doesn't want one ?[facility] f/u appt confirmed? No   ?Are transportation arrangements needed? No  ?If their condition worsens, is the pt aware to call PCP or go to the Emergency Dept.? Yes ?Was the patient provided with contact information for the PCP's office or ED? Yes ?Was to pt encouraged to call back with questions or concerns? Yes  ?

## 2022-01-21 LAB — GC/CHLAMYDIA PROBE AMP (~~LOC~~) NOT AT ARMC
Chlamydia: NEGATIVE
Comment: NEGATIVE
Comment: NORMAL
Neisseria Gonorrhea: NEGATIVE

## 2022-02-12 ENCOUNTER — Ambulatory Visit: Payer: 59 | Admitting: Family Medicine

## 2022-02-20 DIAGNOSIS — Z419 Encounter for procedure for purposes other than remedying health state, unspecified: Secondary | ICD-10-CM | POA: Diagnosis not present

## 2022-03-12 ENCOUNTER — Other Ambulatory Visit: Payer: Self-pay | Admitting: Obstetrics and Gynecology

## 2022-03-12 DIAGNOSIS — Z3009 Encounter for other general counseling and advice on contraception: Secondary | ICD-10-CM

## 2022-03-12 DIAGNOSIS — Z30011 Encounter for initial prescription of contraceptive pills: Secondary | ICD-10-CM

## 2022-03-13 ENCOUNTER — Encounter: Payer: Self-pay | Admitting: Obstetrics and Gynecology

## 2022-03-18 ENCOUNTER — Telehealth: Payer: Self-pay | Admitting: Family Medicine

## 2022-03-18 NOTE — Telephone Encounter (Signed)
Returned patients call. Patient reports that she was seen in the hospital as her period was late and she was having severe cramps. She reports that she had pregnancy test and STI testing that was normal. She did have her cycle on day 45. She reports the bleeding was heavy and passing clots.   She reports she is still having intermittent cramps and the pain is better that it was. She had a cycle on June 16-20 and it was very heavy and soaking pads and underwear. She was taking the OCP's at that time. She is also passing clots with her cycle, some large and some are smaller, the clotting is new.   Patient is requesting an [place]. She has not been seen in the office since 02/28/2021 by virtual appointment. Dr. Donavan Foil sent a message to patient that he will prescribe OCP's for 2 months and she needs to be seen in the office.   Let patient know I will reach out to Dr. Donavan Foil, however she does need a follow up appointment scheduled at first available for the bleeding and birth control. Per front office, patient has been notified it will be September before she can be scheduled.   Will route to Dr. Donavan Foil to inquire about whether [place] needs to ordered now or after seen in the office.

## 2022-03-18 NOTE — Telephone Encounter (Signed)
Patient is requesting an [place], said she went to the hospital

## 2022-03-19 NOTE — Telephone Encounter (Signed)
Spoke with Dr. Donavan Foil, he recommends an appointment prior to scheduling [place]. Called patient to let her know. She did not answer. LM that she will need to have an appointment scheduled prior to an [place]. She has an appointment scheduled for 8/31. Asked her to call the office if she has any questions or concerns.

## 2022-03-22 DIAGNOSIS — Z419 Encounter for procedure for purposes other than remedying health state, unspecified: Secondary | ICD-10-CM | POA: Diagnosis not present

## 2022-04-22 DIAGNOSIS — Z419 Encounter for procedure for purposes other than remedying health state, unspecified: Secondary | ICD-10-CM | POA: Diagnosis not present

## 2022-05-22 ENCOUNTER — Other Ambulatory Visit (HOSPITAL_COMMUNITY)
Admission: RE | Admit: 2022-05-22 | Discharge: 2022-05-22 | Disposition: A | Discharge status: Home / Self Care | Payer: 59 | Source: Ambulatory Visit | Attending: Student | Admitting: Student

## 2022-05-22 ENCOUNTER — Other Ambulatory Visit: Payer: Self-pay

## 2022-05-22 ENCOUNTER — Ambulatory Visit (INDEPENDENT_AMBULATORY_CARE_PROVIDER_SITE_OTHER): Payer: 59 | Admitting: Student

## 2022-05-22 ENCOUNTER — Encounter: Payer: Self-pay | Admitting: Student

## 2022-05-22 VITALS — BP 123/98 | HR 87 | Wt 215.0 lb

## 2022-05-22 DIAGNOSIS — Z3202 Encounter for pregnancy test, result negative: Secondary | ICD-10-CM

## 2022-05-22 DIAGNOSIS — Z124 Encounter for screening for malignant neoplasm of cervix: Secondary | ICD-10-CM | POA: Insufficient documentation

## 2022-05-22 DIAGNOSIS — Z113 Encounter for screening for infections with a predominantly sexual mode of transmission: Secondary | ICD-10-CM

## 2022-05-22 DIAGNOSIS — Z01419 Encounter for gynecological examination (general) (routine) without abnormal findings: Secondary | ICD-10-CM | POA: Diagnosis not present

## 2022-05-22 LAB — POCT PREGNANCY, URINE: Preg Test, Ur: NEGATIVE

## 2022-05-22 NOTE — Progress Notes (Signed)
  History:  Ms. Jamie Moore is a 26 y.o. [id number] who presents to clinic today for well-woman exam. She denies any other symptoms other than pain with intercourse; not consistently. She denies pain with urination, nausea, vomiting. She has heavy periods that last for several days and are accompanied by pain. She would like an [place]. She is not taking anything for birth control.  The following portions of the patient's history were reviewed and updated as appropriate: allergies, current medications, family history, past medical history, social history, past surgical history and problem list.  Review of Systems:  Review of Systems  Constitutional: Negative.   HENT: Negative.    Respiratory: Negative.    Cardiovascular: Negative.   Genitourinary: Negative.   Musculoskeletal: Negative.   Skin: Negative.   Neurological: Negative.   Psychiatric/Behavioral: Negative.        Objective:  Physical Exam BP (!) 123/98   Pulse 87   Wt 215 lb (97.5 kg)   LMP 05/16/2022 (Exact Date)   Breastfeeding No   BMI 38.09 kg/m  Physical Exam Constitutional:      Appearance: Normal appearance.  Cardiovascular:     Rate and Rhythm: Normal rate.  Pulmonary:     Effort: Pulmonary effort is normal.  Musculoskeletal:        General: Normal range of motion.  Skin:    General: Skin is warm.  Neurological:     General: No focal deficit present.     Mental Status: She is alert.   CBE: Breast exam normal: no lumps, masses, tenderness NEFG: trace amount of dark red blood in the vagina, no clots, no odor, no lesions or masses, bimanual exam normal    Labs and Imaging No results found for this or any previous visit (from the past 24 hour(s)).  No results found.  Health Maintenance Due  Topic Date Due   COVID-19 Vaccine (1) Never done   HPV VACCINES (1 - 2-dose series) Never done   PAP-Cervical Cytology Screening  02/09/2022   PAP SMEAR-Modifier  02/09/2022   INFLUENZA VACCINE  04/22/2022     Labs, imaging and previous visits in Epic and Care Everywhere reviewed  Assessment & Plan:  1. Cervical cancer screening -pap with STI testing today  2. Screening examination for STD (sexually transmitted disease) -would like HIV testing -[place] ordered  -undecided about birth control  Approximately 30 minutes of total time was spent with this patient on counseling and care.   Marylene Land, CNM 05/25/2022 7:47 AM

## 2022-05-23 ENCOUNTER — Ambulatory Visit (HOSPITAL_COMMUNITY): Admission: RE | Admit: 2022-05-23 | Payer: 59 | Source: Ambulatory Visit

## 2022-05-23 DIAGNOSIS — Z419 Encounter for procedure for purposes other than remedying health state, unspecified: Secondary | ICD-10-CM | POA: Diagnosis not present

## 2022-05-23 LAB — CERVICOVAGINAL ANCILLARY ONLY
Bacterial Vaginitis (gardnerella): POSITIVE — AB
Candida Glabrata: NEGATIVE
Candida Vaginitis: NEGATIVE
Chlamydia: POSITIVE — AB
Comment: NEGATIVE
Comment: NEGATIVE
Comment: NEGATIVE
Comment: NEGATIVE
Comment: NEGATIVE
Comment: NORMAL
Neisseria Gonorrhea: NEGATIVE
Trichomonas: NEGATIVE

## 2022-05-23 LAB — HIV ANTIBODY (ROUTINE TESTING W REFLEX): HIV Screen 4th Generation wRfx: NONREACTIVE

## 2022-05-28 LAB — CYTOLOGY - PAP
Chlamydia: POSITIVE — AB
Comment: NEGATIVE
Comment: NORMAL
Diagnosis: NEGATIVE
Neisseria Gonorrhea: NEGATIVE

## 2022-05-29 ENCOUNTER — Other Ambulatory Visit: Payer: Self-pay | Admitting: Student

## 2022-05-29 DIAGNOSIS — A749 Chlamydial infection, unspecified: Secondary | ICD-10-CM | POA: Insufficient documentation

## 2022-05-29 MED ORDER — DOXYCYCLINE HYCLATE 100 MG PO TBEC: 100.0000 mg | DELAYED_RELEASE_TABLET | Freq: Two times a day (BID) | ORAL | 0 refills | Status: DC

## 2022-05-29 NOTE — Progress Notes (Unsigned)
xy

## 2022-06-04 ENCOUNTER — Other Ambulatory Visit: Payer: Self-pay | Admitting: *Deleted

## 2022-06-04 ENCOUNTER — Encounter: Payer: Self-pay | Admitting: Student

## 2022-06-04 DIAGNOSIS — A749 Chlamydial infection, unspecified: Secondary | ICD-10-CM

## 2022-06-04 MED ORDER — DOXYCYCLINE HYCLATE 100 MG PO TABS: 100.0000 mg | ORAL_TABLET | Freq: Two times a day (BID) | ORAL | 0 refills | Status: DC

## 2022-06-22 DIAGNOSIS — Z419 Encounter for procedure for purposes other than remedying health state, unspecified: Secondary | ICD-10-CM | POA: Diagnosis not present

## 2022-07-17 ENCOUNTER — Other Ambulatory Visit: Payer: Self-pay

## 2022-07-17 ENCOUNTER — Ambulatory Visit (INDEPENDENT_AMBULATORY_CARE_PROVIDER_SITE_OTHER): Payer: 59

## 2022-07-17 ENCOUNTER — Other Ambulatory Visit (HOSPITAL_COMMUNITY)
Admission: RE | Admit: 2022-07-17 | Discharge: 2022-07-17 | Disposition: A | Discharge status: Home / Self Care | Payer: 59 | Source: Ambulatory Visit | Attending: Family Medicine | Admitting: Family Medicine

## 2022-07-17 VITALS — BP 128/89 | HR 89

## 2022-07-17 DIAGNOSIS — Z3200 Encounter for pregnancy test, result unknown: Secondary | ICD-10-CM

## 2022-07-17 DIAGNOSIS — Z202 Contact with and (suspected) exposure to infections with a predominantly sexual mode of transmission: Secondary | ICD-10-CM

## 2022-07-17 DIAGNOSIS — Z3202 Encounter for pregnancy test, result negative: Secondary | ICD-10-CM | POA: Diagnosis not present

## 2022-07-17 LAB — POCT PREGNANCY, URINE: Preg Test, Ur: NEGATIVE

## 2022-07-17 NOTE — Progress Notes (Signed)
Pt presents today with request for full panel of STD testing. Pt educated in self swab. Self swab obtained as well as blood work. Pt also requested UPT be run at this time. UPT negative. Pt informed.   Pt informed results would be back in 24-48 hours and she would be notified via [place]. Pt verbalized understanding and denied further questions at this time.

## 2022-07-18 LAB — CERVICOVAGINAL ANCILLARY ONLY
Bacterial Vaginitis (gardnerella): POSITIVE — AB
Candida Glabrata: NEGATIVE
Candida Vaginitis: NEGATIVE
Chlamydia: POSITIVE — AB
Comment: NEGATIVE
Comment: NEGATIVE
Comment: NEGATIVE
Comment: NEGATIVE
Comment: NEGATIVE
Comment: NORMAL
Neisseria Gonorrhea: NEGATIVE
Trichomonas: NEGATIVE

## 2022-07-18 LAB — HIV ANTIBODY (ROUTINE TESTING W REFLEX): HIV Screen 4th Generation wRfx: NONREACTIVE

## 2022-07-18 LAB — RPR: RPR Ser Ql: NONREACTIVE

## 2022-07-18 LAB — HEPATITIS B SURFACE ANTIGEN: Hepatitis B Surface Ag: NEGATIVE

## 2022-07-21 ENCOUNTER — Telehealth: Payer: Self-pay | Admitting: *Deleted

## 2022-07-21 ENCOUNTER — Encounter: Payer: Self-pay | Admitting: *Deleted

## 2022-07-21 DIAGNOSIS — A749 Chlamydial infection, unspecified: Secondary | ICD-10-CM

## 2022-07-21 MED ORDER — AZITHROMYCIN 250 MG PO TABS: 1000.0000 mg | ORAL_TABLET | Freq: Once | ORAL | 0 refills | Status: AC

## 2022-07-21 NOTE — Telephone Encounter (Addendum)
I called Jamie Moore and notified her + Chlamydia and that we recommend Doxycycline po BID x 7 days. She asks if she can get the one time med because she was treated with Doxycycline in September and thinks it did not work. I reviewed that she should have her partner treated by his PCP or [facility] and avoid contact for 2 weeks after last person completes treatment. She state she did that after last treatment. I explained I will need to review with provider as protocol is Doxycycline. I discussed with Dr. Damita Dunnings and approved RX for Zithromax 1 gm once. I informed Jamie Moore of RX for Zithromax. I also informed her of + BV, she denies symptoms and declines treatment at present. I also scheduled TOC nurse visit. STD report for [facility] completed. Staci Acosta

## 2022-07-23 DIAGNOSIS — Z419 Encounter for procedure for purposes other than remedying health state, unspecified: Secondary | ICD-10-CM | POA: Diagnosis not present

## 2022-08-04 ENCOUNTER — Other Ambulatory Visit: Payer: Self-pay | Admitting: Family Medicine

## 2022-08-04 DIAGNOSIS — A749 Chlamydial infection, unspecified: Secondary | ICD-10-CM

## 2022-08-04 MED ORDER — DOXYCYCLINE HYCLATE 100 MG PO CAPS: 100.0000 mg | ORAL_CAPSULE | Freq: Two times a day (BID) | ORAL | 0 refills | Status: DC

## 2022-08-19 ENCOUNTER — Ambulatory Visit: Payer: 59

## 2022-08-19 NOTE — Progress Notes (Deleted)
KHIANNA BLAZINA is here with complaint of ***. These symptoms have been present for ***. Patient reports she has {trialed:28378}  Pertinent history:  Plan of care:   Self swab instructions given and specimen obtained. Explained patient will be contacted with any abnormal results. Patient is {annual MOL:07867}  Meryl Crutch, RN 08/19/2022  8:17 AM

## 2022-08-22 DIAGNOSIS — Z419 Encounter for procedure for purposes other than remedying health state, unspecified: Secondary | ICD-10-CM | POA: Diagnosis not present

## 2022-09-22 DIAGNOSIS — Z419 Encounter for procedure for purposes other than remedying health state, unspecified: Secondary | ICD-10-CM | POA: Diagnosis not present

## 2022-09-30 ENCOUNTER — Ambulatory Visit: Payer: 59

## 2022-09-30 DIAGNOSIS — R3989 Other symptoms and signs involving the genitourinary system: Secondary | ICD-10-CM | POA: Diagnosis not present

## 2022-09-30 DIAGNOSIS — Z1339 Encounter for screening examination for other mental health and behavioral disorders: Secondary | ICD-10-CM | POA: Diagnosis not present

## 2022-09-30 DIAGNOSIS — E559 Vitamin D deficiency, unspecified: Secondary | ICD-10-CM | POA: Diagnosis not present

## 2022-09-30 DIAGNOSIS — Z1159 Encounter for screening for other viral diseases: Secondary | ICD-10-CM | POA: Diagnosis not present

## 2022-09-30 DIAGNOSIS — Z3200 Encounter for pregnancy test, result unknown: Secondary | ICD-10-CM | POA: Diagnosis not present

## 2022-09-30 DIAGNOSIS — R5383 Other fatigue: Secondary | ICD-10-CM | POA: Diagnosis not present

## 2022-09-30 DIAGNOSIS — Z6838 Body mass index (BMI) 38.0-38.9, adult: Secondary | ICD-10-CM | POA: Diagnosis not present

## 2022-09-30 DIAGNOSIS — Z1331 Encounter for screening for depression: Secondary | ICD-10-CM | POA: Diagnosis not present

## 2022-09-30 DIAGNOSIS — M545 Low back pain, unspecified: Secondary | ICD-10-CM | POA: Diagnosis not present

## 2022-09-30 DIAGNOSIS — R0602 Shortness of breath: Secondary | ICD-10-CM | POA: Diagnosis not present

## 2022-09-30 DIAGNOSIS — Z0000 Encounter for general adult medical examination without abnormal findings: Secondary | ICD-10-CM | POA: Diagnosis not present

## 2022-10-16 ENCOUNTER — Ambulatory Visit: Payer: Self-pay | Admitting: Obstetrics and Gynecology

## 2022-10-23 DIAGNOSIS — Z419 Encounter for procedure for purposes other than remedying health state, unspecified: Secondary | ICD-10-CM | POA: Diagnosis not present

## 2022-11-21 DIAGNOSIS — Z419 Encounter for procedure for purposes other than remedying health state, unspecified: Secondary | ICD-10-CM | POA: Diagnosis not present

## 2022-11-26 DIAGNOSIS — R350 Frequency of micturition: Secondary | ICD-10-CM | POA: Diagnosis not present

## 2022-11-26 DIAGNOSIS — M79672 Pain in left foot: Secondary | ICD-10-CM | POA: Diagnosis not present

## 2022-11-26 DIAGNOSIS — Z6838 Body mass index (BMI) 38.0-38.9, adult: Secondary | ICD-10-CM | POA: Diagnosis not present

## 2022-11-26 DIAGNOSIS — Z3200 Encounter for pregnancy test, result unknown: Secondary | ICD-10-CM | POA: Diagnosis not present

## 2022-12-10 ENCOUNTER — Ambulatory Visit (INDEPENDENT_AMBULATORY_CARE_PROVIDER_SITE_OTHER): Payer: 59 | Admitting: General Practice

## 2022-12-10 ENCOUNTER — Other Ambulatory Visit: Payer: Self-pay

## 2022-12-10 ENCOUNTER — Other Ambulatory Visit (HOSPITAL_COMMUNITY)
Admission: RE | Admit: 2022-12-10 | Discharge: 2022-12-10 | Disposition: A | Discharge status: Home / Self Care | Payer: 59 | Source: Ambulatory Visit | Attending: Certified Nurse Midwife | Admitting: Certified Nurse Midwife

## 2022-12-10 VITALS — BP 123/85 | HR 81 | Ht 63.0 in | Wt 224.0 lb

## 2022-12-10 DIAGNOSIS — Z113 Encounter for screening for infections with a predominantly sexual mode of transmission: Secondary | ICD-10-CM

## 2022-12-10 NOTE — Progress Notes (Signed)
Patient presents to office today requesting full STD testing. Denies abnormal discharge or odor or known exposure. Patient has a history of chlamydia back in October 2023. She would also like her previously missed ultrasound appt rescheduled. Scheduled for 4/4 @ 12:30. Patient was instructed in self swab and specimen collected as well as blood work. Discussed results should be back in 24-48 hours and available via mychart.  Koren Bound RN BSN 12/10/22

## 2022-12-11 LAB — HIV ANTIBODY (ROUTINE TESTING W REFLEX): HIV Screen 4th Generation wRfx: NONREACTIVE

## 2022-12-11 LAB — CERVICOVAGINAL ANCILLARY ONLY
Chlamydia: NEGATIVE
Comment: NEGATIVE
Comment: NEGATIVE
Comment: NORMAL
Neisseria Gonorrhea: NEGATIVE
Trichomonas: NEGATIVE

## 2022-12-11 LAB — HEPATITIS C ANTIBODY: Hep C Virus Ab: NONREACTIVE

## 2022-12-11 LAB — RPR: RPR Ser Ql: NONREACTIVE

## 2022-12-11 LAB — HEPATITIS B SURFACE ANTIGEN: Hepatitis B Surface Ag: NEGATIVE

## 2022-12-22 DIAGNOSIS — Z419 Encounter for procedure for purposes other than remedying health state, unspecified: Secondary | ICD-10-CM | POA: Diagnosis not present

## 2022-12-25 ENCOUNTER — Ambulatory Visit: Admission: RE | Admit: 2022-12-25 | Payer: 59 | Source: Ambulatory Visit

## 2023-01-21 DIAGNOSIS — Z419 Encounter for procedure for purposes other than remedying health state, unspecified: Secondary | ICD-10-CM | POA: Diagnosis not present

## 2023-02-21 DIAGNOSIS — Z419 Encounter for procedure for purposes other than remedying health state, unspecified: Secondary | ICD-10-CM | POA: Diagnosis not present

## 2023-03-05 ENCOUNTER — Ambulatory Visit
Admission: EM | Admit: 2023-03-05 | Discharge: 2023-03-05 | Disposition: A | Discharge status: Home / Self Care | Payer: 59 | Attending: Urgent Care | Admitting: Urgent Care

## 2023-03-05 DIAGNOSIS — H109 Unspecified conjunctivitis: Secondary | ICD-10-CM | POA: Diagnosis not present

## 2023-03-05 MED ORDER — TOBRAMYCIN 0.3 % OP SOLN: 1.0000 [drp] | OPHTHALMIC | 0 refills | Status: DC

## 2023-03-05 NOTE — ED Triage Notes (Signed)
Pt reports discharge, redness, irritation in left  eyes x 1 day.

## 2023-03-05 NOTE — ED Provider Notes (Signed)
[facility] - URGENT CARE CENTER  Note:  This document was prepared using Conservation officer, historic buildings and may include unintentional dictation errors.  MRN: [id number] DOB: 1995/10/31  Subjective:   Jamie Moore is a 27 y.o. female presenting for 1 day history of persistent left eye redness, irritation, mucus and drainage.  Wears contact lenses but does not have any particular foreign body sensation or any inciting event from contact lens use.  She is not wearing them as of today.  No vision changes.  No current facility-administered medications for this encounter.  Current Outpatient Medications:    doxycycline (VIBRAMYCIN) 100 MG capsule, Take 1 capsule (100 mg total) by mouth 2 (two) times daily., Disp: 14 capsule, Rfl: 0   SPRINTEC 28 0.25-35 MG-MCG tablet, TAKE 1 TABLET BY MOUTH DAILY, Disp: 28 tablet, Rfl: 1   No Known Allergies  Past Medical History:  Diagnosis Date   Anxiety    Headache      Past Surgical History:  Procedure Laterality Date   NO PAST SURGERIES      Family History  Problem Relation Age of Onset   Hypertension Mother    Hypertension Father    Diabetes Maternal Grandmother    Diabetes Paternal Grandmother     Social History   Tobacco Use   Smoking status: Never   Smokeless tobacco: Never  Vaping Use   Vaping Use: Never used  Substance Use Topics   Alcohol use: Yes    Comment: social   Drug use: No    ROS   Objective:   Vitals: BP 118/84 (BP Location: Right Arm)   Pulse 96   Temp 98.6 F (37 C) (Oral)   Resp 18   LMP 02/20/2023 (Exact Date)   SpO2 98%   Physical Exam Constitutional:      General: She is not in acute distress.    Appearance: Normal appearance. She is well-developed. She is not ill-appearing, toxic-appearing or diaphoretic.  HENT:     Head: Normocephalic and atraumatic.     Nose: Nose normal.     Mouth/Throat:     Mouth: Mucous membranes are moist.  Eyes:     General: Lids are normal. Lids  are everted, no foreign bodies appreciated. Vision grossly intact. No scleral icterus.       Right eye: No foreign body, discharge or hordeolum.        Left eye: No foreign body, discharge or hordeolum.     Extraocular Movements: Extraocular movements intact.     Right eye: Normal extraocular motion.     Left eye: Normal extraocular motion and no nystagmus.     Conjunctiva/sclera:     Right eye: Right conjunctiva is not injected. No chemosis, exudate or hemorrhage.    Left eye: Left conjunctiva is injected. No chemosis, exudate or hemorrhage. Cardiovascular:     Rate and Rhythm: Normal rate.  Pulmonary:     Effort: Pulmonary effort is normal.  Skin:    General: Skin is warm and dry.  Neurological:     General: No focal deficit present.     Mental Status: She is alert and oriented to person, place, and time.  Psychiatric:        Mood and Affect: Mood normal.        Behavior: Behavior normal.     Assessment and Plan :   PDMP not reviewed this encounter.  1. Bacterial conjunctivitis of left eye    Will start tobramycin to address bacterial  conjunctivitis of left eye. Counseled patient on potential for adverse effects with medications prescribed/recommended today, ER and return-to-clinic precautions discussed, patient verbalized understanding.    Wallis Bamberg, [place] 03/05/23 1507

## 2023-03-23 DIAGNOSIS — Z419 Encounter for procedure for purposes other than remedying health state, unspecified: Secondary | ICD-10-CM | POA: Diagnosis not present

## 2023-04-23 ENCOUNTER — Other Ambulatory Visit: Payer: Self-pay | Admitting: Student

## 2023-04-23 ENCOUNTER — Encounter: Payer: Self-pay | Admitting: Student

## 2023-04-23 ENCOUNTER — Other Ambulatory Visit: Payer: Self-pay

## 2023-04-23 ENCOUNTER — Ambulatory Visit (INDEPENDENT_AMBULATORY_CARE_PROVIDER_SITE_OTHER): Payer: 59

## 2023-04-23 ENCOUNTER — Other Ambulatory Visit (HOSPITAL_COMMUNITY)
Admission: RE | Admit: 2023-04-23 | Discharge: 2023-04-23 | Disposition: A | Discharge status: Home / Self Care | Payer: 59 | Source: Ambulatory Visit | Attending: Family Medicine | Admitting: Family Medicine

## 2023-04-23 ENCOUNTER — Ambulatory Visit (HOSPITAL_COMMUNITY)
Admission: RE | Admit: 2023-04-23 | Discharge: 2023-04-23 | Disposition: A | Discharge status: Home / Self Care | Payer: 59 | Source: Ambulatory Visit | Attending: Student | Admitting: Student

## 2023-04-23 VITALS — BP 128/87 | HR 99 | Ht 63.0 in | Wt 231.4 lb

## 2023-04-23 DIAGNOSIS — Z124 Encounter for screening for malignant neoplasm of cervix: Secondary | ICD-10-CM

## 2023-04-23 DIAGNOSIS — Z113 Encounter for screening for infections with a predominantly sexual mode of transmission: Secondary | ICD-10-CM

## 2023-04-23 DIAGNOSIS — Z01419 Encounter for gynecological examination (general) (routine) without abnormal findings: Secondary | ICD-10-CM

## 2023-04-23 DIAGNOSIS — Z419 Encounter for procedure for purposes other than remedying health state, unspecified: Secondary | ICD-10-CM | POA: Diagnosis not present

## 2023-04-23 NOTE — Progress Notes (Signed)
Pt here today for STD screening.  Pt reports that she has a new partner and would like to be tested.   Pt also requested a pregnancy test as she is late resulting negative.  Pt explained how to obtain self swab and that we will call with abnormal results.  Pt verbalized understanding with no further questions.   Addison Naegeli, RN  04/23/23

## 2023-04-24 MED ORDER — METRONIDAZOLE 500 MG PO TABS: 500.0000 mg | ORAL_TABLET | Freq: Two times a day (BID) | ORAL | 0 refills | Status: AC

## 2023-04-24 NOTE — Addendum Note (Signed)
Addended by: Reva Bores on: 04/24/2023 02:28 PM   Modules accepted: Orders

## 2023-04-29 ENCOUNTER — Telehealth: Payer: 59 | Admitting: Physician Assistant

## 2023-04-29 DIAGNOSIS — N926 Irregular menstruation, unspecified: Secondary | ICD-10-CM

## 2023-04-29 DIAGNOSIS — R102 Pelvic and perineal pain: Secondary | ICD-10-CM

## 2023-04-29 NOTE — Progress Notes (Signed)
Because this is not something we can properly evaluate via an e-visit and needs examination and further workup, I feel your condition warrants further evaluation and I recommend that you be seen in a face to face visit.   NOTE: There will be NO CHARGE for this eVisit   If you are having a true medical emergency please call 911.      For an urgent face to face visit, [facility] has eight urgent care centers for your convenience:   NEW!! [facility] Health Urgent Care Center at [facility] Get Driving Directions [phone] [address] [place], [address]    [facility] Health Urgent Care Center at [facility] Get Driving Directions [phone] [address] [place], [place] [address]   [facility] Health Urgent Care Center [facility]) Get Driving Directions [phone] [address] [place], [place] [address]  [facility] Health Urgent Care Center [facility]) Get Driving Directions [phone] [address] [place],  [place]  [address]  [facility] Health Urgent Care Center [facility] - at [employer]  [phone] [address] [place],  [place] [address]   [facility] Health Urgent Care at [facility] Get Driving Directions [phone] [address] [place], [place] [address]   [facility] Health Urgent Care at [facility] Get Driving Directions  [phone] [address] [place], [place] [address]   [facility] Urgent Care at [facility] Directions [phone] [address], [address] [place], [place] [address]  Your MyChart E-visit questionnaire answers were reviewed by a board certified advanced clinical practitioner to complete your personal care plan based on your specific symptoms.  Thank you for using e-Visits.

## 2023-05-22 ENCOUNTER — Telehealth: Payer: Self-pay | Admitting: *Deleted

## 2023-05-22 NOTE — Telephone Encounter (Signed)
Jamie Moore called and left a voice message this am stating she is a patient here and got an ultrasound to see if she has fibroids and saw results that show no fibroids. She states she also hasn't had a period and is 47 days late. States she has cramps most everyday and wants to know if she can make an appointment to see what is wrong. States yesterday once when she wiped she saw brown blood with clot and that she had a negative pregnancy test last visit.  I called Alla and she reports she did a home pregnancy test about a week ago and it was negative and she has not had sex since July 4th. We discussed her complaints and that she may be getting a period as sometimes for some women starts off as spotting. I advised she make an appointment for annual exam and may discuss cycle issues at that time. I asked what she is taking for her cramping. She states she is taking ibuprofen as needed. I reviewed appropriate dosage and timing. She voices understanding. I transferred call to front desk to make appointment.  Nancy Fetter

## 2023-05-24 DIAGNOSIS — Z419 Encounter for procedure for purposes other than remedying health state, unspecified: Secondary | ICD-10-CM | POA: Diagnosis not present

## 2023-06-17 DIAGNOSIS — H5213 Myopia, bilateral: Secondary | ICD-10-CM | POA: Diagnosis not present

## 2023-06-23 DIAGNOSIS — Z419 Encounter for procedure for purposes other than remedying health state, unspecified: Secondary | ICD-10-CM | POA: Diagnosis not present

## 2023-06-30 ENCOUNTER — Ambulatory Visit: Payer: 59 | Admitting: Family Medicine

## 2023-07-24 DIAGNOSIS — Z419 Encounter for procedure for purposes other than remedying health state, unspecified: Secondary | ICD-10-CM | POA: Diagnosis not present

## 2023-08-03 ENCOUNTER — Encounter (HOSPITAL_BASED_OUTPATIENT_CLINIC_OR_DEPARTMENT_OTHER): Payer: Self-pay

## 2023-08-03 ENCOUNTER — Emergency Department (HOSPITAL_BASED_OUTPATIENT_CLINIC_OR_DEPARTMENT_OTHER): Payer: 59 | Admitting: Radiology

## 2023-08-03 ENCOUNTER — Other Ambulatory Visit: Payer: Self-pay

## 2023-08-03 DIAGNOSIS — M25561 Pain in right knee: Secondary | ICD-10-CM | POA: Diagnosis present

## 2023-08-03 NOTE — ED Triage Notes (Signed)
R knee pain. No injury reported.

## 2023-08-04 ENCOUNTER — Emergency Department (HOSPITAL_BASED_OUTPATIENT_CLINIC_OR_DEPARTMENT_OTHER)
Admission: EM | Admit: 2023-08-04 | Discharge: 2023-08-04 | Disposition: A | Discharge status: Home / Self Care | Payer: 59 | Attending: Emergency Medicine | Admitting: Emergency Medicine

## 2023-08-04 DIAGNOSIS — M25561 Pain in right knee: Secondary | ICD-10-CM

## 2023-08-04 MED ORDER — KETOROLAC TROMETHAMINE 60 MG/2ML IM SOLN
30.0000 mg | Freq: Once | INTRAMUSCULAR | Status: AC
  Administered 2023-08-04: 30 mg via INTRAMUSCULAR
  Filled 2023-08-04: qty 2

## 2023-08-04 NOTE — Discharge Instructions (Signed)
 For pain control you may take 1000 mg of acetaminophen (Tylenol) every 8 hours and/or 600 mg of Ibuprofen (Motrin, Advil, etc.) every 6-8 hours as needed.  Please limit acetaminophen (Tylenol) to 4000 mg and Ibuprofen (Motrin, Advil, etc.) to 2400 mg for a 24hr period. Please note that other over-the-counter medicine may contain acetaminophen or ibuprofen as a component of their ingredients.

## 2023-08-04 NOTE — ED Provider Notes (Signed)
[place] EMERGENCY DEPARTMENT AT [facility] Provider Note  CSN: [id number] Arrival date & time: 08/03/23 2130  Chief Complaint(s) Knee Pain  HPI Jamie Moore is a 27 y.o. female     Knee Pain Location:  Knee Time since incident:  5 days Injury: no   Knee location:  R knee Pain details:    Quality:  Aching and sharp   Radiates to:  Does not radiate   Severity:  Moderate   Onset quality:  Gradual   Pain timing: initially intermittent. constant for 3 days.   Progression:  Worsening Chronicity:  New Relieved by:  Immobilization Worsened by:  Bearing weight, flexion and rotation Associated symptoms: no decreased ROM, no numbness and no swelling   Risk factors: no recent illness     Past Medical History Past Medical History:  Diagnosis Date   Anxiety    Headache    Patient Active Problem List   Diagnosis Date Noted   Chlamydia 05/29/2022   Birth control counseling 02/28/2021   Encounter for BCP (birth control pills) initial prescription 02/28/2021   Posttraumatic stress disorder    MDD (major depressive disorder), severe (HCC) 08/09/2018   Home Medication(s) Prior to Admission medications   Medication Sig Start Date End Date Taking? Authorizing Provider  SPRINTEC 28 0.25-35 MG-MCG tablet TAKE 1 TABLET BY MOUTH DAILY 03/13/22   Warden Fillers, MD  sertraline (ZOLOFT) 50 MG tablet Take 1 tablet (50 mg total) by mouth daily. For depression Patient not taking: Reported on 02/10/2019 08/13/18 11/17/19  Armandina Stammer I, NP  traZODone (DESYREL) 50 MG tablet Take 1 tablet (50 mg total) by mouth at bedtime as needed for sleep. Patient not taking: Reported on 12/02/2018 08/12/18 11/17/19  Armandina Stammer I, NP                                                                                                                                    Allergies Patient has no known allergies.  Review of Systems Review of Systems As noted in HPI  Physical Exam Vital Signs   I have reviewed the triage vital signs BP (!) 119/90 (BP Location: Left Arm)   Pulse 86   Temp 97.8 F (36.6 C)   Resp 19   Ht 5\' 3"  (1.6 m)   Wt 104.8 kg   LMP 07/18/2023 (Approximate)   SpO2 99%   BMI 40.92 kg/m   Physical Exam Vitals reviewed.  Constitutional:      General: She is not in acute distress.    Appearance: She is well-developed. She is not diaphoretic.  HENT:     Head: Normocephalic and atraumatic.     Right Ear: External ear normal.     Left Ear: External ear normal.     Nose: Nose normal.  Eyes:     General: No scleral icterus.    Conjunctiva/sclera: Conjunctivae normal.  Neck:  Trachea: Phonation normal.  Cardiovascular:     Rate and Rhythm: Normal rate and regular rhythm.  Pulmonary:     Effort: Pulmonary effort is normal. No respiratory distress.     Breath sounds: No stridor.  Abdominal:     General: There is no distension.  Musculoskeletal:        General: Normal range of motion.     Cervical back: Normal range of motion.     Right knee: No swelling, deformity, effusion, ecchymosis, lacerations or bony tenderness. Normal range of motion. No tenderness. No LCL laxity, MCL laxity or ACL laxity. Normal pulse.     Left knee: No swelling, deformity, effusion, ecchymosis or lacerations. Normal range of motion. No tenderness. Normal pulse.  Neurological:     Mental Status: She is alert and oriented to person, place, and time.  Psychiatric:        Behavior: Behavior normal.     ED Results and Treatments Labs (all labs ordered are listed, but only abnormal results are displayed) Labs Reviewed - No data to display                                                                                                                       EKG  EKG Interpretation Date/Time:    Ventricular Rate:    PR Interval:    QRS Duration:    QT Interval:    QTC Calculation:   R Axis:      Text Interpretation:         Radiology DG Knee Complete 4 Views  Right  Result Date: 08/04/2023 CLINICAL DATA:  Right knee pain. EXAM: RIGHT KNEE - COMPLETE 4+ VIEW COMPARISON:  None Available. FINDINGS: No evidence of fracture, dislocation, or joint effusion. No evidence of arthropathy or other focal bone abnormality. Soft tissues are unremarkable. IMPRESSION: Negative. Electronically Signed   By: Aram Candela M.D.   On: 08/04/2023 02:35    Medications Ordered in ED Medications  ketorolac (TORADOL) injection 30 mg (30 mg Intramuscular Given 08/04/23 0212)   Procedures Procedures  (including critical care time) Medical Decision Making / ED Course   Medical Decision Making Amount and/or Complexity of Data Reviewed Radiology: ordered and independent interpretation performed. Decision-making details documented in ED Course.  Risk Prescription drug management.    Right knee pain. Anterior. No trauma. Xray ordered in triage negative. No recent infections. Possible meniscal tear vs ligamentous injury. Doubt DVT or arterial process Doubt septic arthritis.    Final Clinical Impression(s) / ED Diagnoses Final diagnoses:  Acute pain of right knee   The patient appears reasonably screened and/or stabilized for discharge and I doubt any other medical condition or other [facility] requiring further screening, evaluation, or treatment in the ED at this time. I have discussed the findings, Dx and Tx plan with the patient/family who expressed understanding and agree(s) with the plan. Discharge instructions discussed at length. The patient/family was given strict return precautions who verbalized understanding of the instructions. No  further questions at time of discharge.  Disposition: Discharge  Condition: Good  ED Discharge Orders     None       Follow Up: Primary care provider  Call  as needed  London Sheer, MD [address] [place] [address] [phone]  Call  to schedule an appointment for close follow up    This chart  was dictated using voice recognition software.  Despite best efforts to proofread,  errors can occur which can change the documentation meaning.    Nira Conn, MD 08/04/23 [phone]

## 2023-08-23 DIAGNOSIS — Z419 Encounter for procedure for purposes other than remedying health state, unspecified: Secondary | ICD-10-CM | POA: Diagnosis not present

## 2023-09-23 DIAGNOSIS — Z419 Encounter for procedure for purposes other than remedying health state, unspecified: Secondary | ICD-10-CM | POA: Diagnosis not present

## 2023-10-07 ENCOUNTER — Other Ambulatory Visit (HOSPITAL_COMMUNITY)
Admission: RE | Admit: 2023-10-07 | Discharge: 2023-10-07 | Disposition: A | Discharge status: Home / Self Care | Payer: 59 | Source: Ambulatory Visit | Attending: Family Medicine | Admitting: Family Medicine

## 2023-10-07 ENCOUNTER — Other Ambulatory Visit: Payer: Self-pay

## 2023-10-07 ENCOUNTER — Ambulatory Visit (INDEPENDENT_AMBULATORY_CARE_PROVIDER_SITE_OTHER): Payer: 59

## 2023-10-07 VITALS — BP 124/97 | HR 90 | Ht 63.0 in | Wt 227.9 lb

## 2023-10-07 DIAGNOSIS — Z3202 Encounter for pregnancy test, result negative: Secondary | ICD-10-CM | POA: Diagnosis not present

## 2023-10-07 DIAGNOSIS — N898 Other specified noninflammatory disorders of vagina: Secondary | ICD-10-CM | POA: Insufficient documentation

## 2023-10-07 LAB — POCT PREGNANCY, URINE: Preg Test, Ur: NEGATIVE

## 2023-10-07 NOTE — Progress Notes (Signed)
Pt here today for self swab.  Pt reports having thick, white discharge with a mild odor.  Pt also requests a pregnancy test as she has not a had a period since 08/24/23.  BP LA 123/91  BP RA 124/97.   Pt reports she will f/u with PCP, [facility], for elevated blood pressure.  Pt explained how to obtain self swab and that we will call with abnormal results.  Pt verbalized understanding with no further questions.   Leonette Nutting  10/06/22

## 2023-10-08 LAB — CERVICOVAGINAL ANCILLARY ONLY
Bacterial Vaginitis (gardnerella): POSITIVE — AB
Candida Glabrata: NEGATIVE
Candida Vaginitis: NEGATIVE
Chlamydia: NEGATIVE
Comment: NEGATIVE
Comment: NEGATIVE
Comment: NEGATIVE
Comment: NEGATIVE
Comment: NEGATIVE
Comment: NORMAL
Neisseria Gonorrhea: NEGATIVE
Trichomonas: NEGATIVE

## 2023-10-13 ENCOUNTER — Other Ambulatory Visit: Payer: Self-pay | Admitting: Family Medicine

## 2023-10-13 ENCOUNTER — Encounter: Payer: Self-pay | Admitting: Family Medicine

## 2023-10-13 DIAGNOSIS — B9689 Other specified bacterial agents as the cause of diseases classified elsewhere: Secondary | ICD-10-CM

## 2023-10-13 MED ORDER — METRONIDAZOLE 500 MG PO TABS: 500.0000 mg | ORAL_TABLET | Freq: Two times a day (BID) | ORAL | 0 refills | Status: DC

## 2023-10-24 DIAGNOSIS — Z419 Encounter for procedure for purposes other than remedying health state, unspecified: Secondary | ICD-10-CM | POA: Diagnosis not present

## 2023-10-28 ENCOUNTER — Other Ambulatory Visit: Payer: Self-pay

## 2023-10-28 ENCOUNTER — Encounter: Payer: Self-pay | Admitting: Obstetrics and Gynecology

## 2023-10-28 ENCOUNTER — Ambulatory Visit (INDEPENDENT_AMBULATORY_CARE_PROVIDER_SITE_OTHER): Payer: 59 | Admitting: Obstetrics and Gynecology

## 2023-10-28 VITALS — BP 127/91 | HR 99 | Wt 220.4 lb

## 2023-10-28 DIAGNOSIS — Z1331 Encounter for screening for depression: Secondary | ICD-10-CM | POA: Diagnosis not present

## 2023-10-28 DIAGNOSIS — Z30011 Encounter for initial prescription of contraceptive pills: Secondary | ICD-10-CM

## 2023-10-28 DIAGNOSIS — G8929 Other chronic pain: Secondary | ICD-10-CM | POA: Diagnosis not present

## 2023-10-28 DIAGNOSIS — N939 Abnormal uterine and vaginal bleeding, unspecified: Secondary | ICD-10-CM | POA: Diagnosis not present

## 2023-10-28 DIAGNOSIS — Z3202 Encounter for pregnancy test, result negative: Secondary | ICD-10-CM | POA: Diagnosis not present

## 2023-10-28 DIAGNOSIS — Z01419 Encounter for gynecological examination (general) (routine) without abnormal findings: Secondary | ICD-10-CM

## 2023-10-28 DIAGNOSIS — Z3009 Encounter for other general counseling and advice on contraception: Secondary | ICD-10-CM | POA: Diagnosis not present

## 2023-10-28 DIAGNOSIS — M545 Low back pain, unspecified: Secondary | ICD-10-CM

## 2023-10-28 MED ORDER — NORGESTIMATE-ETH ESTRADIOL 0.25-35 MG-MCG PO TABS: 1.0000 | ORAL_TABLET | Freq: Every day | ORAL | 1 refills | Status: DC

## 2023-10-28 NOTE — Progress Notes (Signed)
 ANNUAL EXAM Patient name: Jamie Moore MRN [id number]  Date of birth: [birth year] Chief Complaint:   annual exam  History of Present Illness:   Jamie Moore is a 28 y.o. [phone] being seen today for a routine annual exam.  Current complaints: irregular menses  Menstrual concerns? Yes  irregular mense with cramping  IUGR in her prior pregnany and given muscle relaxer Having pain in her leg and was previously prescribed medication  Breast or nipple changes? No  Contraception use? No  Sexually active? No   Menses were not irregular until after her last menses. Went the summer without a menses. When she does have a menses it wil be heavy with clots. This most recent menses was 20d late and when it arrived it was cramping and painful. Not been on anything. No intetion to get pregnant anytime soon. No other changes during this time. No breast or nipple discharge. Has been on sprintec previously;had nexplanon  and it moved and it had to be removed. Doesn't want to do injection.   Pain in the lower back that started in her pregnancies. Right sided pain that is sharp and will shoot down the side of the leg randomly. Was given muscle realxer during pregnancy   Patient's last menstrual period was 08/24/2023 (exact date).   The pregnancy intention screening data noted above was reviewed. Potential methods of contraception were discussed. The patient elected to proceed with No data recorded.   Last pap     Component Value Date/Time   DIAGPAP  05/22/2022 1403    - Negative for intraepithelial lesion or malignancy (NILM)   DIAGPAP  02/10/2019 0000    NEGATIVE FOR INTRAEPITHELIAL LESIONS OR MALIGNANCY.  BENIGN REACTIVE/REPARATIVE CHANGES.   DIAGPAP  02/10/2019 0000    FUNGAL ORGANISMS PRESENT CONSISTENT WITH CANDIDA SPP.   ADEQPAP  05/22/2022 1403    Satisfactory for evaluation; transformation zone component PRESENT.   ADEQPAP  02/10/2019 0000    Satisfactory for evaluation   endocervical/transformation zone component PRESENT.    Last mammogram: n/a.  Last colonoscopy: n/a.      01/03/2021    4:10 PM 10/25/2020    1:18 PM 08/08/2020   10:59 AM 07/10/2020    9:17 AM 07/20/2019    4:46 PM  Depression screen PHQ 2/9  Decreased Interest 0 0 0 0   Down, Depressed, Hopeless 1 0 0 0   PHQ - 2 Score 1 0 0 0   Altered sleeping 2 0 2 0   Tired, decreased energy 1 1  0   Change in appetite 1 0 0 0   Feeling bad or failure about yourself  0 0 0 0   Trouble concentrating 0 0 0 0   Moving slowly or fidgety/restless 0 0 0 0   Suicidal thoughts 0 0 0 0   PHQ-9 Score 5 1 2  0      Information is confidential and restricted. Go to Review Flowsheets to unlock data.        01/03/2021    4:10 PM 10/25/2020    1:19 PM 08/08/2020   11:00 AM 07/10/2020    9:17 AM  GAD 7 : Generalized Anxiety Score  Nervous, Anxious, on Edge 1 0 0 0  Control/stop worrying 0 0 0 0  Worry too much - different things 0 0 0 0  Trouble relaxing 0 0 2 0  Restless 2 0 0 0  Easily annoyed or irritable 2 3 0 0  Afraid -  awful might happen 0 0 0 0  Total GAD 7 Score 5 3 2  0     Review of Systems:   Pertinent items are noted in HPI Denies any headaches, blurred vision, fatigue, shortness of breath, chest pain, abdominal pain, abnormal vaginal discharge/itching/odor/irritation, problems with periods, bowel movements, urination, or intercourse unless otherwise stated above. Pertinent History Reviewed:  Reviewed past medical,surgical, social and family history.  Reviewed problem list, medications and allergies. Physical Assessment:   Vitals:   10/28/23 1621  BP: (!) 127/91  Pulse: 99  Weight: 220 lb 6 oz (100 kg)  Body mass index is 39.04 kg/m.        Physical Examination:   General appearance - well appearing, and in no distress  Mental status - alert, oriented to person, place, and time  Psych:  She has a normal mood and affect  Skin - warm and dry, normal color, no suspicious  lesions noted  Chest - effort normal, all lung fields clear to auscultation bilaterally  Heart - normal rate and regular rhythm  Abdomen - soft, nontender, nondistended, no masses or organomegaly  Pelvic - deferred  Extremities:  No swelling or varicosities noted  Chaperone present for exam  No results found for this or any previous visit (from the past 24 hours).    Assessment & Plan:  1. Birth control counseling The use of the oral contraceptive has been fully discussed with the patient. This includes the proper method to initiate (i.e. Sunday start after next normal menstrual onset versus same day start) and continue the pills, the need for regular compliance to ensure adequate contraceptive effect, the physiology which make the pill effective, the instructions for what to do in event of a missed pill, and warnings about anticipated minor side effects such as breakthrough spotting, nausea, breast tenderness, weight changes, acne, headaches, etc.  They have been told of the more serious potential side effects such as MI, stroke, and deep vein thrombosis, all of which are very unlikely.  They have been asked to report any signs of such serious problems immediately.  They should back up the pill with a condom during any cycle in which antibiotics are prescribed, and during the first cycle as well. The need for additional protection, such as a condom, to prevent exposure to sexually transmitted diseases has also been discussed- the patient has been clearly reminded that OCP's cannot protect them against diseases such as HIV and others. They understand and wish to take the medication as prescribed. They was given a prescription for Sprintec and will return in 3 months for BP and contraceptive check.  - norgestimate -ethinyl estradiol  (SPRINTEC 28) 0.25-35 MG-MCG tablet; Take 1 tablet by mouth daily.  Dispense: 28 tablet; Refill: 1  2. Encounter for BCP (birth control pills) initial prescription -  norgestimate -ethinyl estradiol  (SPRINTEC 28) 0.25-35 MG-MCG tablet; Take 1 tablet by mouth daily.  Dispense: 28 tablet; Refill: 1  3. Chronic right-sided low back pain without sciatica Referral to PT for further evaluation and management - Ambulatory referral to Physical Therapy  4. Abnormal uterine bleeding (AUB) (Primary) PCOS labs  - CBC; Future - Testosterone ,Free and Total; Future - TSH Rfx on Abnormal to Free T4; Future - Hemoglobin A1c; Future  5. Well woman exam with routine gynecological exam Up to date on pap  Start OCPs for menstrual regulation   No orders of the defined types were placed in this encounter.   Meds: No orders of the defined types were placed  in this encounter.   Follow-up: No follow-ups on file.  Carter Quarry, MD 10/28/2023 4:36 PM

## 2023-10-29 LAB — POCT PREGNANCY, URINE: Preg Test, Ur: NEGATIVE

## 2023-11-11 ENCOUNTER — Ambulatory Visit: Payer: 59 | Attending: Physical Therapy | Admitting: Physical Therapy

## 2023-11-11 NOTE — Therapy (Deleted)
 OUTPATIENT PHYSICAL THERAPY THORACOLUMBAR EVALUATION   Patient Name: Jamie Moore MRN: [id number] DOB:07/19/1996, 28 y.o., female Today's Date: 11/11/2023  END OF SESSION:   Past Medical History:  Diagnosis Date   Anxiety    Headache    Past Surgical History:  Procedure Laterality Date   NO PAST SURGERIES     Patient Active Problem List   Diagnosis Date Noted   Chlamydia 05/29/2022   Birth control counseling 02/28/2021   Encounter for BCP (birth control pills) initial prescription 02/28/2021   Posttraumatic stress disorder    MDD (major depressive disorder), severe (HCC) 08/09/2018    PCP: none per chart    REFERRING PROVIDER: Lorriane Shire MD   REFERRING DIAG: M54.50,G89.29 (ICD-10-CM) - Chronic right-sided low back pain without sciatica  Rationale for Evaluation and Treatment: Rehabilitation  THERAPY DIAG:  No diagnosis found.  ONSET DATE: ***  SUBJECTIVE:                                                                                                                                                                                           SUBJECTIVE STATEMENT: ***  PERTINENT HISTORY:  ***  PAIN:  Are you having pain? Yes: NPRS scale: *** Pain location: *** Pain description: *** Aggravating factors: *** Relieving factors: ***  PRECAUTIONS: {Therapy precautions:24002}  RED FLAGS: {PT Red Flags:29287}   WEIGHT BEARING RESTRICTIONS: No  FALLS:  Has patient fallen in last 6 months? {fallsyesno:27318}  LIVING ENVIRONMENT: Lives with: {OPRC lives with:25569::"lives with their family"} Lives in: {Lives in:25570} Stairs: {opstairs:27293} Has following equipment at home: {Assistive devices:23999}  OCCUPATION: ***  PLOF: {PLOF:24004}  PATIENT GOALS: ***  NEXT MD VISIT: ***  OBJECTIVE:  Note: Objective measures were completed at Evaluation unless otherwise noted.  DIAGNOSTIC FINDINGS:  ***  PATIENT SURVEYS:  Modified Oswestry ***    COGNITION: Overall cognitive status: Within functional limits for tasks assessed     SENSATION: {sensation:27233}  MUSCLE LENGTH: Hamstrings: Right *** deg; Left *** deg Maisie Fus test: Right *** deg; Left *** deg  POSTURE: {posture:25561}  PALPATION: *** LUMBAR ROM:   AROM eval  Flexion   Extension   Right lateral flexion   Left lateral flexion   Right rotation   Left rotation    (Blank rows = not tested)  LOWER EXTREMITY ROM:     Active  Right eval Left eval  Hip flexion    Hip extension    Hip abduction    Hip adduction    Hip internal rotation    Hip external rotation    Knee flexion    Knee extension    Ankle dorsiflexion  Ankle plantarflexion    Ankle inversion    Ankle eversion     (Blank rows = not tested)  LOWER EXTREMITY MMT:    MMT Right eval Left eval  Hip flexion    Hip extension    Hip abduction    Hip adduction    Hip internal rotation    Hip external rotation    Knee flexion    Knee extension    Ankle dorsiflexion    Ankle plantarflexion    Ankle inversion    Ankle eversion     (Blank rows = not tested)  LUMBAR SPECIAL TESTS:  Prone instability test: {pos/neg:25243}, Straight leg raise test: {pos/neg:25243}, Slump test: {pos/neg:25243}, and FABER test: {pos/neg:25243}  FUNCTIONAL TESTS:  5 times sit to stand: *** 30 seconds chair stand test 2 minute walk test: ***  GAIT: Distance walked: *** Assistive device utilized: {Assistive devices:23999} Level of assistance: {Levels of assistance:24026} Comments: ***  TREATMENT DATE:   [facility] Adult PT Treatment:                                                DATE: 11/11/23 Therapeutic Exercise: *** Manual Therapy: *** Neuromuscular re-ed: *** Therapeutic Activity: *** Modalities: *** Self Care: ***                                                                                                                                  PATIENT EDUCATION:  Education details:  *** Person educated: {Person educated:25204} Education method: {Education Method:25205} Education comprehension: {Education Comprehension:25206}  HOME EXERCISE PROGRAM: ***  ASSESSMENT:  CLINICAL IMPRESSION: Patient is a 28 y.o. female who was seen today for physical therapy evaluation and treatment for low back pain .   OBJECTIVE IMPAIRMENTS: {opptimpairments:25111}.   ACTIVITY LIMITATIONS: {activitylimitations:27494}  PARTICIPATION LIMITATIONS: {participationrestrictions:25113}  PERSONAL FACTORS: {Personal factors:25162} are also affecting patient's functional outcome.   REHAB POTENTIAL: {rehabpotential:25112}  CLINICAL DECISION MAKING: {clinical decision making:25114}  EVALUATION COMPLEXITY: {Evaluation complexity:25115}   GOALS: Goals reviewed with patient? Yes  SHORT TERM GOALS=LONG TERM GOALS: Target date: 12/23/2023    Pt will be I with HEP  Baseline:  Goal status: INITIAL  2.  Pt will be able to  Baseline:  Goal status: INITIAL  3.  Pt will be able to  Baseline:  Goal status: INITIAL  4.  Pt will be able to  Baseline:  Goal status: INITIAL  5.  ODI score will improve to  Baseline:  Goal status: INITIAL   PLAN:  PT FREQUENCY: 2x/week  PT DURATION: 6 weeks  PLANNED INTERVENTIONS: 97164- PT Re-evaluation, 97110-Therapeutic exercises, 97530- Therapeutic activity, 97112- Neuromuscular re-education, 97535- Self Care, 16109- Manual therapy, Taping, Dry Needling, Spinal mobilization, Cryotherapy, and Moist heat.  PLAN FOR NEXT SESSION: check HEP    Bell Carbo, PT 11/11/2023, 8:19  AM

## 2023-11-21 DIAGNOSIS — Z419 Encounter for procedure for purposes other than remedying health state, unspecified: Secondary | ICD-10-CM | POA: Diagnosis not present

## 2023-11-26 ENCOUNTER — Other Ambulatory Visit

## 2023-11-30 ENCOUNTER — Other Ambulatory Visit

## 2024-01-02 DIAGNOSIS — Z419 Encounter for procedure for purposes other than remedying health state, unspecified: Secondary | ICD-10-CM | POA: Diagnosis not present

## 2024-02-01 DIAGNOSIS — Z419 Encounter for procedure for purposes other than remedying health state, unspecified: Secondary | ICD-10-CM | POA: Diagnosis not present

## 2024-02-04 ENCOUNTER — Encounter (HOSPITAL_COMMUNITY): Payer: Self-pay | Admitting: *Deleted

## 2024-02-04 ENCOUNTER — Inpatient Hospital Stay (HOSPITAL_COMMUNITY)

## 2024-02-04 ENCOUNTER — Inpatient Hospital Stay (HOSPITAL_COMMUNITY)
Admission: AD | Admit: 2024-02-04 | Discharge: 2024-02-04 | Disposition: A | Discharge status: Home / Self Care | Attending: Obstetrics & Gynecology | Admitting: Obstetrics & Gynecology

## 2024-02-04 DIAGNOSIS — O26851 Spotting complicating pregnancy, first trimester: Secondary | ICD-10-CM | POA: Insufficient documentation

## 2024-02-04 DIAGNOSIS — N939 Abnormal uterine and vaginal bleeding, unspecified: Secondary | ICD-10-CM

## 2024-02-04 DIAGNOSIS — Z3A10 10 weeks gestation of pregnancy: Secondary | ICD-10-CM

## 2024-02-04 DIAGNOSIS — O468X1 Other antepartum hemorrhage, first trimester: Secondary | ICD-10-CM

## 2024-02-04 DIAGNOSIS — R102 Pelvic and perineal pain: Secondary | ICD-10-CM

## 2024-02-04 DIAGNOSIS — O208 Other hemorrhage in early pregnancy: Secondary | ICD-10-CM | POA: Diagnosis not present

## 2024-02-04 DIAGNOSIS — O26891 Other specified pregnancy related conditions, first trimester: Secondary | ICD-10-CM | POA: Insufficient documentation

## 2024-02-04 DIAGNOSIS — O418X10 Other specified disorders of amniotic fluid and membranes, first trimester, not applicable or unspecified: Secondary | ICD-10-CM

## 2024-02-04 HISTORY — DX: Depression, unspecified: F32.A

## 2024-02-04 HISTORY — DX: Urinary tract infection, site not specified: N39.0

## 2024-02-04 HISTORY — DX: Unspecified ovarian cyst, unspecified side: N83.209

## 2024-02-04 LAB — URINALYSIS, ROUTINE W REFLEX MICROSCOPIC
Bilirubin Urine: NEGATIVE
Glucose, UA: NEGATIVE mg/dL
Ketones, ur: NEGATIVE mg/dL
Nitrite: NEGATIVE
Protein, ur: NEGATIVE mg/dL
Specific Gravity, Urine: 1.017 (ref 1.005–1.030)
pH: 7 (ref 5.0–8.0)

## 2024-02-04 LAB — CBC
HCT: 37.1 % (ref 36.0–46.0)
Hemoglobin: 12.5 g/dL (ref 12.0–15.0)
MCH: 28.5 pg (ref 26.0–34.0)
MCHC: 33.7 g/dL (ref 30.0–36.0)
MCV: 84.5 fL (ref 80.0–100.0)
Platelets: 247 10*3/uL (ref 150–400)
RBC: 4.39 MIL/uL (ref 3.87–5.11)
RDW: 13 % (ref 11.5–15.5)
WBC: 6.9 10*3/uL (ref 4.0–10.5)
nRBC: 0 % (ref 0.0–0.2)

## 2024-02-04 LAB — WET PREP, GENITAL
Sperm: NONE SEEN
Trich, Wet Prep: NONE SEEN
WBC, Wet Prep HPF POC: <10 (ref <10)
Yeast Wet Prep HPF POC: NONE SEEN

## 2024-02-04 LAB — HCG, QUANTITATIVE, PREGNANCY: hCG, Beta Chain, Quant, S: 58644 m[IU]/mL — ABNORMAL HIGH (ref <5)

## 2024-02-04 LAB — ABO/RH: ABO/RH(D): O POS

## 2024-02-04 LAB — POCT PREGNANCY, URINE: Preg Test, Ur: POSITIVE — AB

## 2024-02-04 MED ORDER — ACETAMINOPHEN 500 MG PO TABS
1000.0000 mg | ORAL_TABLET | Freq: Once | ORAL | Status: AC
  Administered 2024-02-04: 1000 mg via ORAL
  Filled 2024-02-04: qty 2

## 2024-02-04 NOTE — MAU Provider Note (Signed)
 History     CSN: [id number]  Arrival date and time: 02/04/24 1126   Event Date/Time   First Provider Initiated Contact with Patient 02/04/24 1302      Chief Complaint  Patient presents with   Abdominal Pain   Vaginal Bleeding   Jamie Moore , a  28 y.o. [id number] at [redacted]w[redacted]d presents to MAU with complaints of cramping and vaginal bleeding. Patient states that the cramping has been on-going for a few or 2 now. Reports as intermittent, but more constant and consistent. Currently rates a 7/10 on pain scale and more on the left than the right. Denies attempting to relieve symptoms. She also reports vaginal spotting and bleeding that started today. She reports light pink to dark red bleeding only with wiping. She denies having to wear a pad or passing clots. She denies abnormal vaginal discharge or urinary symptoms. Patient has a NOB appt scheduled with [facility].          OB History     Gravida  4   Para  2   Term  2   Preterm      AB  1   Living  2      SAB  1   IAB      Ectopic      Multiple  0   Live Births  2        Obstetric Comments  "Growth restriction" 2019 2022         Past Medical History:  Diagnosis Date   Anxiety    Depression    post partum 2019 2022   Headache    Ovarian cyst    UTI (urinary tract infection)     Past Surgical History:  Procedure Laterality Date   DILATION AND CURETTAGE OF UTERUS      Family History  Problem Relation Age of Onset   Hypertension Mother    Hypertension Father    Diabetes Maternal Grandmother    Diabetes Paternal Grandmother     Social History   Tobacco Use   Smoking status: Never   Smokeless tobacco: Never  Vaping Use   Vaping status: Never Used  Substance Use Topics   Alcohol use: Not Currently    Comment: social   Drug use: No    Allergies: No Known Allergies  Medications Prior to Admission  Medication Sig Dispense Refill Last Dose/Taking   metroNIDAZOLE  (FLAGYL ) 500  MG tablet Take 1 tablet (500 mg total) by mouth 2 (two) times daily. (Patient not taking: Reported on 10/28/2023) 14 tablet 0    norgestimate -ethinyl estradiol  (SPRINTEC 28) 0.25-35 MG-MCG tablet Take 1 tablet by mouth daily. 28 tablet 1     Review of Systems  Constitutional:  Negative for chills, fatigue and fever.  Eyes:  Negative for pain and visual disturbance.  Respiratory:  Negative for apnea, shortness of breath and wheezing.   Cardiovascular:  Negative for chest pain and palpitations.  Gastrointestinal:  Positive for abdominal pain. Negative for constipation, diarrhea, nausea and vomiting.  Genitourinary:  Positive for vaginal bleeding and vaginal discharge. Negative for difficulty urinating, dysuria, pelvic pain and vaginal pain.  Musculoskeletal:  Negative for back pain.  Neurological:  Negative for seizures, weakness and headaches.  Psychiatric/Behavioral:  Negative for suicidal ideas.    Physical Exam   Blood pressure 131/87, pulse (!) 107, temperature 98.3 F (36.8 C), temperature source Oral, resp. rate 17, height 5\' 3"  (1.6 m), weight 100.9 kg, last menstrual period 11/26/2023, SpO2  100%.  Physical Exam Vitals and nursing note reviewed.  Constitutional:      General: She is not in acute distress.    Appearance: Normal appearance.  HENT:     Head: Normocephalic.  Pulmonary:     Effort: Pulmonary effort is normal.  Musculoskeletal:     Cervical back: Normal range of motion.  Skin:    General: Skin is warm and dry.  Neurological:     Mental Status: She is alert and oriented to person, place, and time.  Psychiatric:        Mood and Affect: Mood normal.     MAU Course  Procedures Orders Placed This Encounter  Procedures   Wet prep, genital   US  OB Comp Less 14 Wks   Urinalysis, Routine w reflex microscopic -Urine, Clean Catch   CBC   hCG, quantitative, pregnancy   Diet NPO time specified   Pregnancy, urine POC   ABO/Rh   Results for orders placed or  performed during the hospital encounter of 02/04/24 (from the past 24 hours)  Pregnancy, urine POC     Status: Abnormal   Collection Time: 02/04/24 11:52 AM  Result Value Ref Range   Preg Test, Ur POSITIVE (A) NEGATIVE  Urinalysis, Routine w reflex microscopic -Urine, Clean Catch     Status: Abnormal   Collection Time: 02/04/24 11:58 AM  Result Value Ref Range   Color, Urine YELLOW YELLOW   APPearance CLOUDY (A) CLEAR   Specific Gravity, Urine 1.017 1.005 - 1.030   pH 7.0 5.0 - 8.0   Glucose, UA NEGATIVE NEGATIVE mg/dL   Hgb urine dipstick MODERATE (A) NEGATIVE   Bilirubin Urine NEGATIVE NEGATIVE   Ketones, ur NEGATIVE NEGATIVE mg/dL   Protein, ur NEGATIVE NEGATIVE mg/dL   Nitrite NEGATIVE NEGATIVE   Leukocytes,Ua TRACE (A) NEGATIVE   RBC / HPF 0-5 0 - 5 RBC/hpf   WBC, UA 0-5 0 - 5 WBC/hpf   Bacteria, UA RARE (A) NONE SEEN   Squamous Epithelial / HPF 6-10 0 - 5 /HPF   Mucus PRESENT    Amorphous Crystal PRESENT   Wet prep, genital     Status: Abnormal   Collection Time: 02/04/24 11:58 AM   Specimen: PATH Cytology Cervicovaginal Ancillary Only  Result Value Ref Range   Yeast Wet Prep HPF POC NONE SEEN NONE SEEN   Trich, Wet Prep NONE SEEN NONE SEEN   Clue Cells Wet Prep HPF POC PRESENT (A) NONE SEEN   WBC, Wet Prep HPF POC <10 <10   Sperm NONE SEEN   CBC     Status: None   Collection Time: 02/04/24 12:35 PM  Result Value Ref Range   WBC 6.9 4.0 - 10.5 K/uL   RBC 4.39 3.87 - 5.11 MIL/uL   Hemoglobin 12.5 12.0 - 15.0 g/dL   HCT 46.9 62.9 - 52.8 %   MCV 84.5 80.0 - 100.0 fL   MCH 28.5 26.0 - 34.0 pg   MCHC 33.7 30.0 - 36.0 g/dL   RDW 41.3 24.4 - 01.0 %   Platelets 247 150 - 400 K/uL   nRBC 0.0 0.0 - 0.2 %  ABO/Rh     Status: None (Preliminary result)   Collection Time: 02/04/24 12:35 PM  Result Value Ref Range   ABO/RH(D) PENDING     MDM - Wet prep noted for clue cells, criteria not met for treatment,  - US  revealed a single living IUP measuring ~ 10weeks 0 days  consistent with LMP.  -  [facility] noted on US  likely cause for vaginal spotting  - PO tylenol  given for pain.  - Plan for discharge.   Assessment and Plan   1. Subchorionic hematoma in first trimester, single or unspecified fetus   2. [redacted] weeks gestation of pregnancy   3. Pelvic pain   4. Vaginal bleeding    - Reviewed worsening signs and return precautions.  - Discussed bleeding expectations involving [facility].  - Safe meds in pregnancy list provided.  - Patient discharged home in stable condition and amy return to MAU as needed.   Corie Diamond, MSN CNM  02/04/2024, 1:02 PM

## 2024-02-04 NOTE — MAU Note (Signed)
 Jamie Moore is a 28 y.o. at Unknown here in MAU reporting: is about 10wks preg.  Started bleeding (light per picture, no clots) and cramping really bad.  Having sharp pains in her vagina.  Was nauseated today- worse than any other time in preg.  Preg has not been confirmed any where. Also reporting has been having an increase in migraine headaches- just on one side, very sensitive to light. LMP: 3/6 Onset of complaint: 2 days Pain score: abd mod, vag mod Vitals:   02/04/24 1139  BP: 131/87  Pulse: (!) 107  Resp: 17  Temp: 98.3 F (36.8 C)  SpO2: 100%      Lab orders placed from triage:  upt/ua/ vag swabs

## 2024-02-05 LAB — GC/CHLAMYDIA PROBE AMP (~~LOC~~) NOT AT ARMC
Chlamydia: NEGATIVE
Comment: NEGATIVE
Comment: NORMAL
Neisseria Gonorrhea: NEGATIVE

## 2024-03-03 DIAGNOSIS — Z419 Encounter for procedure for purposes other than remedying health state, unspecified: Secondary | ICD-10-CM | POA: Diagnosis not present

## 2024-04-02 DIAGNOSIS — Z419 Encounter for procedure for purposes other than remedying health state, unspecified: Secondary | ICD-10-CM | POA: Diagnosis not present

## 2024-04-08 ENCOUNTER — Encounter: Payer: Self-pay | Admitting: Advanced Practice Midwife

## 2024-05-03 DIAGNOSIS — Z419 Encounter for procedure for purposes other than remedying health state, unspecified: Secondary | ICD-10-CM | POA: Diagnosis not present

## 2024-05-31 ENCOUNTER — Other Ambulatory Visit (HOSPITAL_COMMUNITY)
Admission: RE | Admit: 2024-05-31 | Discharge: 2024-05-31 | Disposition: A | Discharge status: Home / Self Care | Source: Ambulatory Visit | Attending: Obstetrics and Gynecology | Admitting: Obstetrics and Gynecology

## 2024-05-31 ENCOUNTER — Ambulatory Visit (INDEPENDENT_AMBULATORY_CARE_PROVIDER_SITE_OTHER): Admitting: Obstetrics and Gynecology

## 2024-05-31 ENCOUNTER — Other Ambulatory Visit: Payer: Self-pay

## 2024-05-31 ENCOUNTER — Encounter: Payer: Self-pay | Admitting: Obstetrics and Gynecology

## 2024-05-31 VITALS — BP 109/76 | HR 90 | Wt 226.6 lb

## 2024-05-31 DIAGNOSIS — Z1331 Encounter for screening for depression: Secondary | ICD-10-CM

## 2024-05-31 DIAGNOSIS — Z113 Encounter for screening for infections with a predominantly sexual mode of transmission: Secondary | ICD-10-CM | POA: Diagnosis not present

## 2024-05-31 DIAGNOSIS — Z30011 Encounter for initial prescription of contraceptive pills: Secondary | ICD-10-CM | POA: Diagnosis not present

## 2024-05-31 DIAGNOSIS — N939 Abnormal uterine and vaginal bleeding, unspecified: Secondary | ICD-10-CM

## 2024-05-31 DIAGNOSIS — Z3009 Encounter for other general counseling and advice on contraception: Secondary | ICD-10-CM

## 2024-05-31 MED ORDER — NORGESTIMATE-ETH ESTRADIOL 0.25-35 MG-MCG PO TABS: 1.0000 | ORAL_TABLET | Freq: Every day | ORAL | 11 refills | Status: DC

## 2024-05-31 NOTE — Progress Notes (Signed)
 GYNECOLOGY VISIT  Patient name: Jamie Moore MRN [id number]  Date of birth: Mar 25, 1996 Chief Complaint:   Follow-up  History:  Discussed the use of AI scribe software for clinical note transcription with the patient, who gave verbal consent to proceed.  History of Present Illness Jamie Moore is a 28 year old female who presents with concerns of bacterial vaginosis and menstrual irregularities.  She has symptoms of vaginal discharge and is undergoing laboratory tests today.  This is either her second or third menstrual cycle since her most recent pregnancy. Initially, her cycles were irregular, but they have now become monthly. However, her periods are still characterized by significant clotting and are very painful. She also experiences dyspareunia, which she feels is worsening over time.  She has a history of using Sprintec for birth control and wants to restart it. No complications from a previous procedure (TAB) and no need for a blood transfusion. No new diagnoses of liver problems, migraines, heart attack, breast cancer, stroke, or blood clots to preclude use of CHCs.  She is undergoing STD testing and blood work today, including blood count, A1c, testosterone , and thyroid  tests, due to her history of irregular and heavy bleeding.  The following portions of the patient's history were reviewed and updated as appropriate: allergies, current medications, past family history, past medical history, past social history, past surgical history and problem list.   Health Maintenance:   Last pap     Component Value Date/Time   DIAGPAP  05/22/2022 1403    - Negative for intraepithelial lesion or malignancy (NILM)   DIAGPAP  02/10/2019 0000    NEGATIVE FOR INTRAEPITHELIAL LESIONS OR MALIGNANCY.  BENIGN REACTIVE/REPARATIVE CHANGES.   DIAGPAP  02/10/2019 0000    FUNGAL ORGANISMS PRESENT CONSISTENT WITH CANDIDA SPP.   ADEQPAP  05/22/2022 1403    Satisfactory for evaluation;  transformation zone component PRESENT.   ADEQPAP  02/10/2019 0000    Satisfactory for evaluation  endocervical/transformation zone component PRESENT.    Health Maintenance  Topic Date Due   HPV Vaccine (1 - 3-dose series) Never done   Hepatitis B Vaccine (1 of 3 - 19+ 3-dose series) Never done   Flu Shot  04/22/2024   COVID-19 Vaccine (1 - 2024-25 season) Never done   Pap Smear  05/22/2025   DTaP/Tdap/Td vaccine (2 - Td or Tdap) 03/02/2028   Hepatitis C Screening  Completed   HIV Screening  Completed   Pneumococcal Vaccine  Aged Out   Meningitis B Vaccine  Aged Out      Review of Systems:  Pertinent items are noted in HPI. Comprehensive review of systems was otherwise negative.   Objective:  Physical Exam BP 109/76   Pulse 90   Wt 226 lb 9.6 oz (102.8 kg)   LMP 05/17/2024   Breastfeeding Unknown   BMI 40.14 kg/m    Physical Exam Vitals and nursing note reviewed.  Constitutional:      Appearance: Normal appearance.  HENT:     Head: Normocephalic and atraumatic.  Pulmonary:     Effort: Pulmonary effort is normal.  Skin:    General: Skin is warm and dry.  Neurological:     General: No focal deficit present.     Mental Status: She is alert.  Psychiatric:        Mood and Affect: Mood normal.        Behavior: Behavior normal.        Thought Content: Thought content normal.  Judgment: Judgment normal.         Assessment & Plan:  Assessment and Plan Assessment & Plan Abnormal uterine bleeding with heavy, painful periods Experiencing heavy, painful periods with significant clotting post-pregnancy. Dyspareunia noted. - Refill Sprintec prescription and send to pharmacy. - Order CBC, A1c, testosterone , and thyroid  function tests.  Dyspareunia Reports painful intercourse, possibly muscular or uterine in origin. Sprintec may stabilize hormonal fluctuations and reduce uterine activity. - Monitor response to Sprintec. - Consider pelvic physical therapy if  pain persists.  Vaginal discharge, possible bacterial vaginosis - Self swab for vaginitis completed, will follow up results   Contraceptive management and counseling Interested in restarting Sprintec for contraception, previously used without complications. - No CHC contraindications, prescription sent to preferred pharmacy  Follow-Up Plan for follow-up in 3-4 months. Virtual if asymptomatic, in-person if symptoms persist.   Carter Quarry, MD [facility], [facility]

## 2024-06-01 ENCOUNTER — Ambulatory Visit: Payer: Self-pay | Admitting: Obstetrics and Gynecology

## 2024-06-01 DIAGNOSIS — B9689 Other specified bacterial agents as the cause of diseases classified elsewhere: Secondary | ICD-10-CM

## 2024-06-01 LAB — CERVICOVAGINAL ANCILLARY ONLY
Bacterial Vaginitis (gardnerella): POSITIVE — AB
Chlamydia: NEGATIVE
Comment: NEGATIVE
Comment: NEGATIVE
Comment: NORMAL
Neisseria Gonorrhea: NEGATIVE

## 2024-06-01 LAB — TSH RFX ON ABNORMAL TO FREE T4: TSH: 1.89 u[IU]/mL (ref 0.450–4.500)

## 2024-06-01 MED ORDER — METRONIDAZOLE 500 MG PO TABS: 500.0000 mg | ORAL_TABLET | Freq: Two times a day (BID) | ORAL | 0 refills | Status: AC

## 2024-06-03 DIAGNOSIS — Z419 Encounter for procedure for purposes other than remedying health state, unspecified: Secondary | ICD-10-CM | POA: Diagnosis not present

## 2024-06-06 LAB — RPR+HBSAG+HCVAB+...
HIV Screen 4th Generation wRfx: NONREACTIVE
Hep C Virus Ab: NONREACTIVE
Hepatitis B Surface Ag: NEGATIVE
RPR Ser Ql: NONREACTIVE

## 2024-06-06 LAB — TESTOSTERONE,FREE AND TOTAL
Testosterone, Free: 1.4 pg/mL (ref 0.0–4.2)
Testosterone: 52 ng/dL (ref 13–71)

## 2024-06-06 LAB — CBC
Hematocrit: 39.6 % (ref 34.0–46.6)
Hemoglobin: 13 g/dL (ref 11.1–15.9)
MCH: 28.8 pg (ref 26.6–33.0)
MCHC: 32.8 g/dL (ref 31.5–35.7)
MCV: 88 fL (ref 79–97)
Platelets: 267 x10E3/uL (ref 150–450)
RBC: 4.51 x10E6/uL (ref 3.77–5.28)
RDW: 12.9 % (ref 11.7–15.4)
WBC: 5.5 x10E3/uL (ref 3.4–10.8)

## 2024-06-06 LAB — HEMOGLOBIN A1C
Est. average glucose Bld gHb Est-mCnc: 108 mg/dL
Hgb A1c MFr Bld: 5.4 % (ref 4.8–5.6)

## 2024-07-03 ENCOUNTER — Emergency Department (HOSPITAL_BASED_OUTPATIENT_CLINIC_OR_DEPARTMENT_OTHER)
Admission: EM | Admit: 2024-07-03 | Discharge: 2024-07-03 | Disposition: A | Discharge status: Home / Self Care | Attending: Emergency Medicine | Admitting: Emergency Medicine

## 2024-07-03 ENCOUNTER — Encounter (HOSPITAL_BASED_OUTPATIENT_CLINIC_OR_DEPARTMENT_OTHER): Payer: Self-pay | Admitting: Urology

## 2024-07-03 ENCOUNTER — Other Ambulatory Visit: Payer: Self-pay

## 2024-07-03 DIAGNOSIS — O2391 Unspecified genitourinary tract infection in pregnancy, first trimester: Secondary | ICD-10-CM | POA: Diagnosis not present

## 2024-07-03 DIAGNOSIS — Z3A01 Less than 8 weeks gestation of pregnancy: Secondary | ICD-10-CM | POA: Insufficient documentation

## 2024-07-03 DIAGNOSIS — R8271 Bacteriuria: Secondary | ICD-10-CM | POA: Insufficient documentation

## 2024-07-03 DIAGNOSIS — O210 Mild hyperemesis gravidarum: Secondary | ICD-10-CM | POA: Insufficient documentation

## 2024-07-03 DIAGNOSIS — O219 Vomiting of pregnancy, unspecified: Secondary | ICD-10-CM | POA: Diagnosis present

## 2024-07-03 LAB — CBC WITH DIFFERENTIAL/PLATELET
Abs Immature Granulocytes: 0.02 K/uL (ref 0.00–0.07)
Basophils Absolute: 0 K/uL (ref 0.0–0.1)
Basophils Relative: 0 %
Eosinophils Absolute: 0.1 K/uL (ref 0.0–0.5)
Eosinophils Relative: 1 %
HCT: 40.9 % (ref 36.0–46.0)
Hemoglobin: 13.8 g/dL (ref 12.0–15.0)
Immature Granulocytes: 0 %
Lymphocytes Relative: 31 %
Lymphs Abs: 3 K/uL (ref 0.7–4.0)
MCH: 28.7 pg (ref 26.0–34.0)
MCHC: 33.7 g/dL (ref 30.0–36.0)
MCV: 85 fL (ref 80.0–100.0)
Monocytes Absolute: 0.6 K/uL (ref 0.1–1.0)
Monocytes Relative: 6 %
Neutro Abs: 6.1 K/uL (ref 1.7–7.7)
Neutrophils Relative %: 62 %
Platelets: 278 K/uL (ref 150–400)
RBC: 4.81 MIL/uL (ref 3.87–5.11)
RDW: 13.3 % (ref 11.5–15.5)
WBC: 9.8 K/uL (ref 4.0–10.5)
nRBC: 0 % (ref 0.0–0.2)

## 2024-07-03 LAB — URINALYSIS, ROUTINE W REFLEX MICROSCOPIC
Glucose, UA: NEGATIVE mg/dL
Ketones, ur: >=80 mg/dL — AB
Leukocytes,Ua: NEGATIVE
Nitrite: NEGATIVE
Protein, ur: 100 mg/dL — AB
Specific Gravity, Urine: >=1.03 (ref 1.005–1.030)
pH: 6 (ref 5.0–8.0)

## 2024-07-03 LAB — COMPREHENSIVE METABOLIC PANEL WITH GFR
ALT: 7 U/L (ref 0–44)
AST: 20 U/L (ref 15–41)
Albumin: 4.6 g/dL (ref 3.5–5.0)
Alkaline Phosphatase: 44 U/L (ref 38–126)
Anion gap: 13 (ref 5–15)
BUN: 8 mg/dL (ref 6–20)
CO2: 20 mmol/L — ABNORMAL LOW (ref 22–32)
Calcium: 9.2 mg/dL (ref 8.9–10.3)
Chloride: 101 mmol/L (ref 98–111)
Creatinine, Ser: 0.71 mg/dL (ref 0.44–1.00)
GFR, Estimated: >60 mL/min (ref >60)
Glucose, Bld: 101 mg/dL — ABNORMAL HIGH (ref 70–99)
Potassium: 3.7 mmol/L (ref 3.5–5.1)
Sodium: 135 mmol/L (ref 135–145)
Total Bilirubin: 0.6 mg/dL (ref 0.0–1.2)
Total Protein: 7.7 g/dL (ref 6.5–8.1)

## 2024-07-03 LAB — URINALYSIS, MICROSCOPIC (REFLEX)

## 2024-07-03 LAB — HCG, QUANTITATIVE, PREGNANCY: hCG, Beta Chain, Quant, S: 41176 m[IU]/mL — ABNORMAL HIGH (ref <5)

## 2024-07-03 MED ORDER — LACTATED RINGERS IV BOLUS
1000.0000 mL | Freq: Once | INTRAVENOUS | Status: AC
  Administered 2024-07-03: 1000 mL via INTRAVENOUS

## 2024-07-03 MED ORDER — ONDANSETRON HCL 4 MG/2ML IJ SOLN
4.0000 mg | Freq: Once | INTRAMUSCULAR | Status: AC
  Administered 2024-07-03: 4 mg via INTRAVENOUS
  Filled 2024-07-03: qty 2

## 2024-07-03 MED ORDER — CEPHALEXIN 500 MG PO CAPS: 500.0000 mg | ORAL_CAPSULE | Freq: Two times a day (BID) | ORAL | 0 refills | Status: DC

## 2024-07-03 MED ORDER — DOXYLAMINE-PYRIDOXINE 10-10 MG PO TBEC: 1.0000 | DELAYED_RELEASE_TABLET | Freq: Two times a day (BID) | ORAL | 0 refills | Status: AC | PRN

## 2024-07-03 MED ORDER — CEPHALEXIN 250 MG PO CAPS
500.0000 mg | ORAL_CAPSULE | Freq: Once | ORAL | Status: AC
  Administered 2024-07-03: 500 mg via ORAL
  Filled 2024-07-03: qty 2

## 2024-07-03 MED ORDER — ONDANSETRON HCL 4 MG PO TABS: 4.0000 mg | ORAL_TABLET | Freq: Four times a day (QID) | ORAL | 0 refills | Status: DC | PRN

## 2024-07-03 NOTE — ED Notes (Signed)
 Urine cup at bedside, pt unable to urinate at this time

## 2024-07-03 NOTE — ED Notes (Signed)
 Water given for PO challenge.

## 2024-07-03 NOTE — ED Provider Notes (Signed)
 [facility] Provider Note   CSN: [id number] Arrival date & time: 07/03/24  9970     History Chief Complaint  Patient presents with   Emesis During Pregnancy    HPI Jamie Moore is a 28 y.o. female presenting for chief complaint of nausea and vomiting. 6 weeks since LMP.  G4 P2.  Denies fevers chills abdominal pain, pelvic pain, discharge.  States that she has had some hyperemesis with prior pregnancies but this episode is the most severe.  No tolerance of p.o. intake today..    Patient's recorded medical, surgical, social, medication list and allergies were reviewed in the Snapshot window as part of the initial history.   Review of Systems   Review of Systems  Constitutional:  Negative for chills and fever.  HENT:  Negative for ear pain and sore throat.   Eyes:  Negative for pain and visual disturbance.  Respiratory:  Negative for cough and shortness of breath.   Cardiovascular:  Negative for chest pain and palpitations.  Gastrointestinal:  Positive for nausea and vomiting. Negative for abdominal pain.  Genitourinary:  Negative for dysuria and hematuria.  Musculoskeletal:  Negative for arthralgias and back pain.  Skin:  Negative for color change and rash.  Neurological:  Negative for seizures and syncope.  All other systems reviewed and are negative.   Physical Exam Updated Vital Signs BP (!) 121/90 (BP Location: Right Arm)   Pulse 92   Temp 98.3 F (36.8 C) (Oral)   Resp 18   Ht 5' 3 (1.6 m)   Wt 102.8 kg   LMP  (LMP Unknown)   SpO2 97%   BMI 40.15 kg/m  Physical Exam Vitals and nursing note reviewed.  Constitutional:      General: She is not in acute distress.    Appearance: She is well-developed.  HENT:     Head: Normocephalic and atraumatic.  Eyes:     Conjunctiva/sclera: Conjunctivae normal.  Cardiovascular:     Rate and Rhythm: Normal rate and regular rhythm.     Heart sounds: No murmur  heard. Pulmonary:     Effort: Pulmonary effort is normal. No respiratory distress.     Breath sounds: Normal breath sounds.  Abdominal:     General: There is no distension.     Palpations: Abdomen is soft.     Tenderness: There is no abdominal tenderness. There is no right CVA tenderness or left CVA tenderness.  Musculoskeletal:        General: No swelling or tenderness. Normal range of motion.     Cervical back: Neck supple.  Skin:    General: Skin is warm and dry.  Neurological:     General: No focal deficit present.     Mental Status: She is alert and oriented to person, place, and time. Mental status is at baseline.     Cranial Nerves: No cranial nerve deficit.      ED Course/ Medical Decision Making/ A&P    Procedures Procedures   Medications Ordered in ED Medications  lactated ringers  bolus 1,000 mL (0 mLs Intravenous Stopped 07/03/24 0151)  ondansetron  (ZOFRAN ) injection 4 mg (4 mg Intravenous Given 07/03/24 0107)  cephALEXin  (KEFLEX ) capsule 500 mg (500 mg Oral Given 07/03/24 0154)    Medical Decision Making:   Jamie Moore is a 28 y.o. female who presented to the ED today with nausea and vomiting detailed above.    Additional history discussed with patient's family/caregivers.  Patient placed on continuous vitals and telemetry monitoring while in ED which was reviewed periodically.  Complete initial physical exam performed, notably the patient  was hemodynamically stable in no acute distress.    Reviewed and confirmed nursing documentation for past medical history, family history, social history.    Initial Assessment:   With the patient's presentation of nausea and vomiting, most likely diagnosis is hyperemesis gravidarum. Other diagnoses were considered including (but not limited to) cyclic vomiting, appendicitis, cholecystitis, small bowel obstruction. These are considered less likely due to history of present illness and physical exam findings.   This is  most consistent with an acute life/limb threatening illness complicated by underlying chronic conditions.  Initial Plan:  Screening labs including CBC and Metabolic panel to evaluate for infectious or metabolic etiology of disease.  Urinalysis with reflex culture ordered to evaluate for UTI or relevant urologic/nephrologic pathology.  Objective evaluation as below reviewed   Initial Study Results:   Laboratory  All laboratory results reviewed without evidence of clinically relevant pathology.   Exceptions include: Positive ketones on urine  Reassessment and Plan:   Patient's history of present on his physical exam findings are most consistent with hyperemesis gravidarum.  She is grossly resolved after administration of Zofran .  Discussed trying to keep her on Diclegis for first-line but patient states that on prior pregnancy she has required Zofran  and would like to have a prescription for that as well.  She stated she would discuss this as part of her ongoing care with OB/GYN. Also has asymptomatic bacteriuria of pregnancy.  Will start on Keflex  and again recommend discussing with GYN within 5 days.  She is ambulatory tolerating p.o. intake on reassessment.  Disposition:  I have considered need for hospitalization, however, considering all of the above, I believe this patient is stable for discharge at this time.  Patient/family educated about specific return precautions for given chief complaint and symptoms.  Patient/family educated about follow-up with PCP.    Patient/family expressed understanding of return precautions and need for follow-up. Patient spoken to regarding all imaging and laboratory results and appropriate follow up for these results. All education provided in verbal form with additional information in written form. Time was allowed for answering of patient questions. Patient discharged.    Emergency Department Medication Summary:   Medications  lactated ringers  bolus  1,000 mL (0 mLs Intravenous Stopped 07/03/24 0151)  ondansetron  (ZOFRAN ) injection 4 mg (4 mg Intravenous Given 07/03/24 0107)  cephALEXin  (KEFLEX ) capsule 500 mg (500 mg Oral Given 07/03/24 0154)         Clinical Impression:  1. Asymptomatic bacteriuria   2. Hyperemesis gravidarum      Discharge   Final Clinical Impression(s) / ED Diagnoses Final diagnoses:  Asymptomatic bacteriuria  Hyperemesis gravidarum    Rx / DC Orders ED Discharge Orders          Ordered    cephALEXin  (KEFLEX ) 500 MG capsule  2 times daily        07/03/24 0139    ondansetron  (ZOFRAN ) 4 MG tablet  Every 6 hours PRN        07/03/24 0140    Doxylamine-Pyridoxine 10-10 MG TBEC  Every 12 hours PRN        07/03/24 0140              Jerral Meth, MD 07/03/24 0404

## 2024-07-03 NOTE — ED Triage Notes (Signed)
 Pos preg test earlier this week at home  No ob visit yet  Nausea that started earlier this week and got bad yesterday  Having dizzy spells  Vomiting today, not tolerating po intake   LMP unknown    G-4, P-2

## 2024-07-04 ENCOUNTER — Inpatient Hospital Stay (HOSPITAL_COMMUNITY)
Admission: AD | Admit: 2024-07-04 | Discharge: 2024-07-04 | Disposition: A | Discharge status: Home / Self Care | Attending: Obstetrics & Gynecology | Admitting: Obstetrics & Gynecology

## 2024-07-04 ENCOUNTER — Telehealth: Payer: Self-pay | Admitting: Family Medicine

## 2024-07-04 ENCOUNTER — Other Ambulatory Visit: Payer: Self-pay

## 2024-07-04 ENCOUNTER — Encounter (HOSPITAL_COMMUNITY): Payer: Self-pay

## 2024-07-04 DIAGNOSIS — Z3A01 Less than 8 weeks gestation of pregnancy: Secondary | ICD-10-CM | POA: Diagnosis not present

## 2024-07-04 DIAGNOSIS — O219 Vomiting of pregnancy, unspecified: Secondary | ICD-10-CM

## 2024-07-04 DIAGNOSIS — Z3490 Encounter for supervision of normal pregnancy, unspecified, unspecified trimester: Secondary | ICD-10-CM

## 2024-07-04 DIAGNOSIS — O2341 Unspecified infection of urinary tract in pregnancy, first trimester: Secondary | ICD-10-CM

## 2024-07-04 DIAGNOSIS — O3680X0 Pregnancy with inconclusive fetal viability, not applicable or unspecified: Secondary | ICD-10-CM

## 2024-07-04 LAB — CBC
HCT: 43.6 % (ref 36.0–46.0)
Hemoglobin: 14.5 g/dL (ref 12.0–15.0)
MCH: 28.4 pg (ref 26.0–34.0)
MCHC: 33.3 g/dL (ref 30.0–36.0)
MCV: 85.5 fL (ref 80.0–100.0)
Platelets: 270 K/uL (ref 150–400)
RBC: 5.1 MIL/uL (ref 3.87–5.11)
RDW: 13.2 % (ref 11.5–15.5)
WBC: 8.7 K/uL (ref 4.0–10.5)
nRBC: 0 % (ref 0.0–0.2)

## 2024-07-04 LAB — URINALYSIS, MICROSCOPIC (REFLEX)

## 2024-07-04 LAB — URINALYSIS, ROUTINE W REFLEX MICROSCOPIC
Glucose, UA: NEGATIVE mg/dL
Ketones, ur: >80 mg/dL — AB
Leukocytes,Ua: NEGATIVE
Nitrite: NEGATIVE
Protein, ur: 100 mg/dL — AB
Specific Gravity, Urine: >1.03 — ABNORMAL HIGH (ref 1.005–1.030)
pH: 6 (ref 5.0–8.0)

## 2024-07-04 LAB — COMPREHENSIVE METABOLIC PANEL WITH GFR
ALT: 13 U/L (ref 0–44)
AST: 20 U/L (ref 15–41)
Albumin: 4.3 g/dL (ref 3.5–5.0)
Alkaline Phosphatase: 39 U/L (ref 38–126)
Anion gap: 13 (ref 5–15)
BUN: 7 mg/dL (ref 6–20)
CO2: 20 mmol/L — ABNORMAL LOW (ref 22–32)
Calcium: 9.5 mg/dL (ref 8.9–10.3)
Chloride: 101 mmol/L (ref 98–111)
Creatinine, Ser: 0.81 mg/dL (ref 0.44–1.00)
GFR, Estimated: >60 mL/min (ref >60)
Glucose, Bld: 117 mg/dL — ABNORMAL HIGH (ref 70–99)
Potassium: 3.5 mmol/L (ref 3.5–5.1)
Sodium: 134 mmol/L — ABNORMAL LOW (ref 135–145)
Total Bilirubin: 1.1 mg/dL (ref 0.0–1.2)
Total Protein: 8.1 g/dL (ref 6.5–8.1)

## 2024-07-04 MED ORDER — SCOPOLAMINE 1 MG/3DAYS TD PT72: 1.0000 | MEDICATED_PATCH | TRANSDERMAL | 0 refills | Status: DC

## 2024-07-04 MED ORDER — METOCLOPRAMIDE HCL 5 MG/ML IJ SOLN
10.0000 mg | Freq: Once | INTRAMUSCULAR | Status: AC
  Administered 2024-07-04: 10 mg via INTRAVENOUS
  Filled 2024-07-04: qty 2

## 2024-07-04 MED ORDER — GLYCOPYRROLATE 2 MG PO TABS: 2.0000 mg | ORAL_TABLET | Freq: Three times a day (TID) | ORAL | 3 refills | Status: AC | PRN

## 2024-07-04 MED ORDER — METOCLOPRAMIDE HCL 10 MG PO TABS: 10.0000 mg | ORAL_TABLET | Freq: Four times a day (QID) | ORAL | 0 refills | Status: DC | PRN

## 2024-07-04 MED ORDER — SCOPOLAMINE 1 MG/3DAYS TD PT72
1.0000 | MEDICATED_PATCH | TRANSDERMAL | Status: DC
  Administered 2024-07-04: 1 mg via TRANSDERMAL
  Filled 2024-07-04: qty 1

## 2024-07-04 MED ORDER — LACTATED RINGERS IV BOLUS
1000.0000 mL | Freq: Once | INTRAVENOUS | Status: AC
  Administered 2024-07-04: 1000 mL via INTRAVENOUS

## 2024-07-04 MED ORDER — NITROFURANTOIN MONOHYD MACRO 100 MG PO CAPS: 100.0000 mg | ORAL_CAPSULE | Freq: Two times a day (BID) | ORAL | 0 refills | Status: DC

## 2024-07-04 MED ORDER — GLYCOPYRROLATE 0.2 MG/ML IJ SOLN
0.2000 mg | Freq: Four times a day (QID) | INTRAMUSCULAR | Status: DC | PRN
  Administered 2024-07-04: 0.2 mg via INTRAVENOUS
  Filled 2024-07-04: qty 1

## 2024-07-04 NOTE — Discharge Instructions (Signed)

## 2024-07-04 NOTE — Telephone Encounter (Signed)
 Patient was seen in MAU and was told to call to start prenatal care. Patient is unsure of LMP and does not know how far along she is. I scheduled patient based off of providers notes from her MAU visit. I let the patient know that someone may reach out to her to schedule a dating US .

## 2024-07-04 NOTE — MAU Provider Note (Signed)
 S Ms. Jamie Moore is a 28 y.o. [phone] patient who presents to MAU today with complaint of nausea vomiting in pregnancy.  Patient is approximately [redacted] weeks pregnant and states that she has been vomiting and unable to tolerate p.o. food or fluid.  She was seen yesterday in the ED and evaluated for hyperemesis was given IV fluids,  antiemetics and is also being treated for asymptomatic bacteriuria.   The patient states the medication she was given for the N/V ( Zofran  and Diclegis)  did not work for her and therefore she returned since she is still unable to keep down p.o. food or fluids.  She denies any vaginal bleeding, leaking of fluid, abdominal cramping.  Remainder of the ROS is negative unless noted in HPI  O BP 131/80 (BP Location: Right Arm)   Pulse 85   Temp 97.9 F (36.6 C) (Oral)   Resp 19   Ht 5' 3 (1.6 m)   LMP  (LMP Unknown)   SpO2 100%   BMI 40.15 kg/m  Physical Exam Vitals and nursing note reviewed.  Constitutional:      General: She is not in acute distress.    Appearance: She is obese. She is ill-appearing.  HENT:     Head: Normocephalic.     Nose: Nose normal.     Mouth/Throat:     Mouth: Mucous membranes are moist.  Cardiovascular:     Rate and Rhythm: Normal rate.  Pulmonary:     Effort: Pulmonary effort is normal.  Abdominal:     Palpations: Abdomen is soft.  Musculoskeletal:        General: Normal range of motion.     Cervical back: Normal range of motion.  Skin:    General: Skin is warm.  Neurological:     Mental Status: She is alert and oriented to person, place, and time.  Psychiatric:        Behavior: Behavior normal.     MDM  HIGH  N/V in pregnancy  CBC: NM CMP: NM UA: c/w UTI ( Patient DX in ED and was given Keflex ) - Will plan to change to Macrobid on discharge and send Culture) IVF- LR Bolus Antiemetics ( Reglan  and Robinul and scopolamine patch) PO Challenge tolerated   Reassessed at 0700 patient sleeping comfortably   Will plan for discharge   Differential diagnosis with nausea/vomiting includes but is not limited to: nausea due to pregnancy, hyperemesis gravidarum, viral infection, gastroenteritis, cholecystitis, pancreatitis, ACS, food poisoning, diabetic ketoacidosis, drug ingestion, gastroparesis, alcohol/opiate withdrawal   Orders Placed This Encounter  Procedures   Culture, OB Urine    Standing Status:   Standing    Number of Occurrences:   1   Urinalysis, Routine w reflex microscopic -Urine, Clean Catch    Standing Status:   Standing    Number of Occurrences:   1    Specimen Source:   Urine, Clean Catch [76]   CBC    Standing Status:   Standing    Number of Occurrences:   1   Comprehensive metabolic panel    Standing Status:   Standing    Number of Occurrences:   1   Urinalysis, Microscopic (reflex)    Standing Status:   Standing    Number of Occurrences:   1    Meds ordered this encounter  Medications   lactated ringers  bolus 1,000 mL   metoCLOPramide  (REGLAN ) injection 10 mg   glycopyrrolate (ROBINUL) injection 0.2 mg   scopolamine (  TRANSDERM-SCOP) 1 MG/3DAYS 1 mg     Results for orders placed or performed during the hospital encounter of 07/04/24 (from the past 24 hours)  Urinalysis, Routine w reflex microscopic -Urine, Clean Catch     Status: Abnormal   Collection Time: 07/04/24  4:14 AM  Result Value Ref Range   Color, Urine YELLOW YELLOW   APPearance HAZY (A) CLEAR   Specific Gravity, Urine >1.030 (H) 1.005 - 1.030   pH 6.0 5.0 - 8.0   Glucose, UA NEGATIVE NEGATIVE mg/dL   Hgb urine dipstick MODERATE (A) NEGATIVE   Bilirubin Urine SMALL (A) NEGATIVE   Ketones, ur >80 (A) NEGATIVE mg/dL   Protein, ur 899 (A) NEGATIVE mg/dL   Nitrite NEGATIVE NEGATIVE   Leukocytes,Ua NEGATIVE NEGATIVE  Urinalysis, Microscopic (reflex)     Status: Abnormal   Collection Time: 07/04/24  4:14 AM  Result Value Ref Range   RBC / HPF 0-5 0 - 5 RBC/hpf   WBC, UA 0-5 0 - 5 WBC/hpf    Bacteria, UA FEW (A) NONE SEEN   Squamous Epithelial / HPF 0-5 0 - 5 /HPF   Mucus PRESENT    Urine-Other MICROSCOPIC EXAM PERFORMED ON UNCONCENTRATED URINE   CBC     Status: None   Collection Time: 07/04/24  5:39 AM  Result Value Ref Range   WBC 8.7 4.0 - 10.5 K/uL   RBC 5.10 3.87 - 5.11 MIL/uL   Hemoglobin 14.5 12.0 - 15.0 g/dL   HCT 56.3 63.9 - 53.9 %   MCV 85.5 80.0 - 100.0 fL   MCH 28.4 26.0 - 34.0 pg   MCHC 33.3 30.0 - 36.0 g/dL   RDW 86.7 88.4 - 84.4 %   Platelets 270 150 - 400 K/uL   nRBC 0.0 0.0 - 0.2 %     I have reviewed the patient chart and performed the physical exam . I have ordered & interpreted the lab results and reviewed them with the patient Medications ordered as stated above/ below.  A/P as described below.  Counseling and education provided and patient agreeable  with plan as described below. Verbalized understanding.    ASSESSMENT Medical screening exam complete Nausea and vomiting during pregnancy  [redacted] weeks gestation of pregnancy  UTI (urinary tract infection) during pregnancy, first trimester     PLAN   Discharge from MAU in stable condition  See AVS for full description of educational information and instructions provided to the patient at time of discharge  Warning signs for worsening condition that would warrant emergency follow-up discussed  Patient may return to MAU as needed   Littie Olam LABOR, NP 07/04/2024 6:32 AM   This chart was dictated using voice recognition software, Dragon. Despite the best efforts of this provider to proofread and correct errors, errors may still occur which can change documentation meaning.

## 2024-07-04 NOTE — MAU Note (Signed)
..  Jamie Moore is a 28 y.o. at Unknown here in MAU reporting: nausea vomiting, has vomited twice every hour since Friday night . Has not been able to keep anything down.  Took zofran  and diclegis but it has not helped.  Denies vaginal bleeding or leaking of fluid. LMP: unknown  Pain score: 0/10 Vitals:   07/04/24 0451  BP: 131/80  Pulse: 85  Resp: 19  Temp: 97.9 F (36.6 C)  SpO2: 100%     Lab orders placed from triage:  UA

## 2024-07-04 NOTE — Telephone Encounter (Signed)
 RN attempted to contact pt to scheduled dating/viability US .  Attempted to call number listed in pt chart 3 times, message played the call cannot be completed.    [place], RN

## 2024-07-04 NOTE — Addendum Note (Signed)
 Addended by: ELBY WADDELL CROME on: 07/04/2024 03:00 PM   Modules accepted: Orders

## 2024-07-05 ENCOUNTER — Inpatient Hospital Stay (HOSPITAL_COMMUNITY)

## 2024-07-05 ENCOUNTER — Inpatient Hospital Stay (HOSPITAL_COMMUNITY)
Admission: AD | Admit: 2024-07-05 | Discharge: 2024-07-05 | Disposition: A | Discharge status: Home / Self Care | Attending: Obstetrics and Gynecology | Admitting: Obstetrics and Gynecology

## 2024-07-05 ENCOUNTER — Other Ambulatory Visit: Payer: Self-pay

## 2024-07-05 DIAGNOSIS — R109 Unspecified abdominal pain: Secondary | ICD-10-CM | POA: Diagnosis not present

## 2024-07-05 DIAGNOSIS — O219 Vomiting of pregnancy, unspecified: Secondary | ICD-10-CM

## 2024-07-05 DIAGNOSIS — Z3A01 Less than 8 weeks gestation of pregnancy: Secondary | ICD-10-CM | POA: Diagnosis not present

## 2024-07-05 DIAGNOSIS — O26891 Other specified pregnancy related conditions, first trimester: Secondary | ICD-10-CM | POA: Diagnosis not present

## 2024-07-05 LAB — URINALYSIS, ROUTINE W REFLEX MICROSCOPIC
Bacteria, UA: NONE SEEN
Bilirubin Urine: NEGATIVE
Glucose, UA: NEGATIVE mg/dL
Hgb urine dipstick: NEGATIVE
Ketones, ur: 80 mg/dL — AB
Leukocytes,Ua: NEGATIVE
Nitrite: NEGATIVE
Protein, ur: 100 mg/dL — AB
Specific Gravity, Urine: 1.031 — ABNORMAL HIGH (ref 1.005–1.030)
pH: 6 (ref 5.0–8.0)

## 2024-07-05 LAB — WET PREP, GENITAL
Sperm: NONE SEEN
Trich, Wet Prep: NONE SEEN
WBC, Wet Prep HPF POC: >=10 — AB (ref <10)

## 2024-07-05 MED ORDER — GLYCOPYRROLATE 1 MG PO TABS
1.0000 mg | ORAL_TABLET | Freq: Once | ORAL | Status: AC
  Administered 2024-07-05: 1 mg via ORAL
  Filled 2024-07-05: qty 1

## 2024-07-05 MED ORDER — PROMETHAZINE HCL 25 MG PO TABS
25.0000 mg | ORAL_TABLET | Freq: Four times a day (QID) | ORAL | 0 refills | Status: AC | PRN
Start: 2024-07-05 — End: ?

## 2024-07-05 MED ORDER — PROCHLORPERAZINE MALEATE 10 MG PO TABS
10.0000 mg | ORAL_TABLET | Freq: Two times a day (BID) | ORAL | 2 refills | Status: AC | PRN
Start: 2024-07-05 — End: ?

## 2024-07-05 MED ORDER — ONDANSETRON 4 MG PO TBDP
8.0000 mg | ORAL_TABLET | Freq: Once | ORAL | Status: AC
  Administered 2024-07-05: 8 mg via ORAL
  Filled 2024-07-05: qty 2

## 2024-07-05 MED ORDER — ONDANSETRON 8 MG PO TBDP: 8.0000 mg | ORAL_TABLET | Freq: Three times a day (TID) | ORAL | 2 refills | Status: AC | PRN

## 2024-07-05 MED ORDER — FAMOTIDINE 20 MG PO TABS: 20.0000 mg | ORAL_TABLET | Freq: Two times a day (BID) | ORAL | 0 refills | Status: AC

## 2024-07-05 MED ORDER — METOCLOPRAMIDE HCL 10 MG PO TABS: 10.0000 mg | ORAL_TABLET | Freq: Four times a day (QID) | ORAL | 2 refills | Status: DC | PRN

## 2024-07-05 MED ORDER — PROMETHAZINE HCL 25 MG RE SUPP: 25.0000 mg | Freq: Four times a day (QID) | RECTAL | 0 refills | Status: AC | PRN

## 2024-07-05 MED ORDER — METOCLOPRAMIDE HCL 10 MG PO TABS: 10.0000 mg | ORAL_TABLET | Freq: Once | ORAL | Status: DC

## 2024-07-05 MED ORDER — SCOPOLAMINE 1 MG/3DAYS TD PT72: 1.0000 | MEDICATED_PATCH | TRANSDERMAL | 11 refills | Status: AC

## 2024-07-05 MED ORDER — PROMETHAZINE HCL 25 MG PO TABS: 25.0000 mg | ORAL_TABLET | Freq: Once | ORAL | Status: DC

## 2024-07-05 MED ORDER — FAMOTIDINE 20 MG PO TABS
20.0000 mg | ORAL_TABLET | Freq: Once | ORAL | Status: AC
  Administered 2024-07-05: 20 mg via ORAL
  Filled 2024-07-05: qty 1

## 2024-07-05 MED ORDER — PROCHLORPERAZINE MALEATE 10 MG PO TABS
10.0000 mg | ORAL_TABLET | Freq: Once | ORAL | Status: DC
  Filled 2024-07-05: qty 1

## 2024-07-05 NOTE — MAU Note (Signed)
 GERALDY AKRIDGE is a 28 y.o. at Unknown here in MAU reporting: after being seen in the ED Sunday and Monday she continues to have N/V despite taking prescribed meds.  States she's taken Reglan , Robinul, Scopolamine today.  Reports hasn't  taken Diclegis, Phenergan  & Zofran   since yesterday.  Denies VB, states having sharp constant lower abdominal pain.   LMP: beginning of September Onset of complaint: ongoing Pain score: 7 Vitals:   07/05/24 1605  BP: 111/70  Pulse: 68  Resp: 20  Temp: 98.3 F (36.8 C)  SpO2: 100%     FHT: NA  Lab orders placed from triage: UA

## 2024-07-05 NOTE — MAU Provider Note (Signed)
 History     CSN: [id number]  Arrival date and time: 07/05/24 1535   Event Date/Time   First Provider Initiated Contact with Patient 07/05/24 1724      No chief complaint on file.  HPI  Jamie Moore is a 28 y.o. [id number] who presents for evaluation of lower abdominal cramping and nausea. Patient reports she has lower abdominal cramping across the whole bottom of her abdomen. Patient rates the pain as a 6/10 and has not tried anything for the pain. She reports she is still struggling with nausea and vomiting. She last vomited this am. She states she took nausea medication at 1300 and has not thrown up anymore but still feels nauseous. She has not tried to eat because she is afraid she will vomit.  She denies any vaginal bleeding and discharge. Denies any constipation, diarrhea or any urinary complaints.   OB History     Gravida  5   Para  2   Term  2   Preterm      AB  2   Living  2      SAB  1   IAB  1   Ectopic      Multiple  0   Live Births  2        Obstetric Comments  Growth restriction 2019 2022         Past Medical History:  Diagnosis Date   Anxiety    Depression    post partum 2019 2022   Headache    Ovarian cyst    UTI (urinary tract infection)     Past Surgical History:  Procedure Laterality Date   DILATION AND CURETTAGE OF UTERUS      Family History  Problem Relation Age of Onset   Hypertension Mother    Hypertension Father    Diabetes Maternal Grandmother    Diabetes Paternal Grandmother     Social History   Tobacco Use   Smoking status: Never   Smokeless tobacco: Never  Vaping Use   Vaping status: Never Used  Substance Use Topics   Alcohol use: Not Currently    Comment: social   Drug use: Yes    Types: Marijuana    Allergies: No Known Allergies  Medications Prior to Admission  Medication Sig Dispense Refill Last Dose/Taking   dimenhyDRINATE (DRAMAMINE) 50 MG tablet Take 50 mg by mouth every 8 (eight)  hours as needed.      Doxylamine-Pyridoxine 10-10 MG TBEC Take 1 tablet by mouth every 12 (twelve) hours as needed. 60 tablet 0    glycopyrrolate (ROBINUL) 2 MG tablet Take 1 tablet (2 mg total) by mouth 3 (three) times daily as needed. 30 tablet 3    metoCLOPramide  (REGLAN ) 10 MG tablet Take 1 tablet (10 mg total) by mouth every 6 (six) hours as needed for nausea or vomiting. 30 tablet 0    nitrofurantoin, macrocrystal-monohydrate, (MACROBID) 100 MG capsule Take 1 capsule (100 mg total) by mouth 2 (two) times daily. 14 capsule 0    ondansetron  (ZOFRAN ) 4 MG tablet Take 1 tablet (4 mg total) by mouth every 6 (six) hours as needed for up to 30 doses for nausea or vomiting. 30 tablet 0    scopolamine (TRANSDERM-SCOP) 1 MG/3DAYS Place 1 patch (1 mg total) onto the skin every 3 (three) days. 10 patch 0     Review of Systems Physical Exam   Blood pressure 111/70, pulse 68, temperature 98.3 F (36.8 C), temperature source Oral,  resp. rate 20, height 5' 3 (1.6 m), weight 98.3 kg, SpO2 100%, unknown if currently breastfeeding.  Patient Vitals for the past 24 hrs:  BP Temp Temp src Pulse Resp SpO2 Height Weight  07/05/24 1605 111/70 98.3 F (36.8 C) Oral 68 20 100 % -- --  07/05/24 1555 -- -- -- -- -- -- 5' 3 (1.6 m) 98.3 kg    Physical Exam  Fetal Tracing:  Baseline: Variability: Accels: Decels:  Toco:      MAU Course  Procedures  Results for orders placed or performed during the hospital encounter of 07/05/24 (from the past 24 hours)  Urinalysis, Routine w reflex microscopic -Urine, Clean Catch     Status: Abnormal   Collection Time: 07/05/24  4:28 PM  Result Value Ref Range   Color, Urine YELLOW YELLOW   APPearance HAZY (A) CLEAR   Specific Gravity, Urine 1.031 (H) 1.005 - 1.030   pH 6.0 5.0 - 8.0   Glucose, UA NEGATIVE NEGATIVE mg/dL   Hgb urine dipstick NEGATIVE NEGATIVE   Bilirubin Urine NEGATIVE NEGATIVE   Ketones, ur 80 (A) NEGATIVE mg/dL   Protein, ur 899 (A)  NEGATIVE mg/dL   Nitrite NEGATIVE NEGATIVE   Leukocytes,Ua NEGATIVE NEGATIVE   RBC / HPF 0-5 0 - 5 RBC/hpf   WBC, UA 0-5 0 - 5 WBC/hpf   Bacteria, UA NONE SEEN NONE SEEN   Squamous Epithelial / HPF 0-5 0 - 5 /HPF   Mucus PRESENT   Wet prep, genital     Status: Abnormal   Collection Time: 07/05/24  4:35 PM   Specimen: PATH Cytology Cervicovaginal Ancillary Only  Result Value Ref Range   Yeast Wet Prep HPF POC PRESENT (A) NONE SEEN   Trich, Wet Prep NONE SEEN NONE SEEN   Clue Cells Wet Prep HPF POC PRESENT (A) NONE SEEN   WBC, Wet Prep HPF POC >=10 (A) <10   Sperm NONE SEEN      US  OB LESS THAN 14 WEEKS WITH OB TRANSVAGINAL Result Date: 07/05/2024 CLINICAL DATA:  Cramps, nausea, vomiting EXAM: OBSTETRIC <14 WK US  AND TRANSVAGINAL OB US  TECHNIQUE: Both transabdominal and transvaginal ultrasound examinations were performed for complete evaluation of the gestation as well as the maternal uterus, adnexal regions, and pelvic cul-de-sac. Transvaginal technique was performed to assess early pregnancy. COMPARISON:  None Available. FINDINGS: Intrauterine gestational sac: Single Yolk sac:  Visualized. Embryo:  Visualized. Cardiac Activity: Visualized. Heart Rate: 125 bpm MSD:   mm    w     d CRL:  7.7 mm   6 w   5 d                  US  EDC: 02/23/2025 Subchorionic hemorrhage:  None visualized. Maternal uterus/adnexae: No adnexal mass or free fluid. IMPRESSION: Six week 5 day intrauterine pregnancy. Fetal heart rate 125 beats per minute. No acute maternal findings. Electronically Signed   By: Franky Crease M.D.   On: 07/05/2024 17:19     MDM Labs ordered and reviewed.   UA Wet prep/gc/chlamydia US  OB Transvaginal  CNM independently reviewed the imaging ordered. Imaging show live IUP  No vomiting while in MAU  Zofran  Robinul Pepcid  Reviewed expectations for management of nausea and vomiting in pregnancy and importance of taking PO meds on a schedule to help manage symptoms.   Assessment  and Plan  No diagnosis found.  -Discharge home in stable condition -Rx for antiemetics sent to pharmacy and refills of other medications  provided.  -First trimester precautions discussed -Patient advised to follow-up with OB as scheduled for prenatal care -Patient may return to MAU as needed or if her condition were to change or worsen  Aleck CHRISTELLA Fireman, CNM 07/05/2024, 5:24 PM

## 2024-07-05 NOTE — Discharge Instructions (Signed)

## 2024-07-06 LAB — GC/CHLAMYDIA PROBE AMP (~~LOC~~) NOT AT ARMC
Chlamydia: NEGATIVE
Comment: NEGATIVE
Comment: NORMAL
Neisseria Gonorrhea: NEGATIVE

## 2024-07-06 LAB — CULTURE, OB URINE: Culture: 50000 — AB

## 2024-08-03 ENCOUNTER — Telehealth (INDEPENDENT_AMBULATORY_CARE_PROVIDER_SITE_OTHER): Admitting: *Deleted

## 2024-08-03 ENCOUNTER — Encounter: Payer: Self-pay | Admitting: *Deleted

## 2024-08-03 DIAGNOSIS — E669 Obesity, unspecified: Secondary | ICD-10-CM

## 2024-08-03 DIAGNOSIS — O2341 Unspecified infection of urinary tract in pregnancy, first trimester: Secondary | ICD-10-CM

## 2024-08-03 DIAGNOSIS — O99211 Obesity complicating pregnancy, first trimester: Secondary | ICD-10-CM | POA: Diagnosis not present

## 2024-08-03 DIAGNOSIS — Z3A10 10 weeks gestation of pregnancy: Secondary | ICD-10-CM | POA: Diagnosis not present

## 2024-08-03 DIAGNOSIS — Z8759 Personal history of other complications of pregnancy, childbirth and the puerperium: Secondary | ICD-10-CM | POA: Insufficient documentation

## 2024-08-03 DIAGNOSIS — O0990 Supervision of high risk pregnancy, unspecified, unspecified trimester: Secondary | ICD-10-CM | POA: Insufficient documentation

## 2024-08-03 DIAGNOSIS — O09291 Supervision of pregnancy with other poor reproductive or obstetric history, first trimester: Secondary | ICD-10-CM

## 2024-08-03 DIAGNOSIS — Z3490 Encounter for supervision of normal pregnancy, unspecified, unspecified trimester: Secondary | ICD-10-CM | POA: Insufficient documentation

## 2024-08-03 DIAGNOSIS — Z3201 Encounter for pregnancy test, result positive: Secondary | ICD-10-CM

## 2024-08-03 DIAGNOSIS — O99210 Obesity complicating pregnancy, unspecified trimester: Secondary | ICD-10-CM | POA: Insufficient documentation

## 2024-08-03 MED ORDER — BLOOD PRESSURE KIT DEVI: 1.0000 | 0 refills | Status: DC | PRN

## 2024-08-03 MED ORDER — BLOOD PRESSURE KIT DEVI: 1.0000 | 0 refills | Status: AC | PRN

## 2024-08-03 MED ORDER — PRENATAL 27-1 MG PO TABS: 1.0000 | ORAL_TABLET | Freq: Every day | ORAL | 11 refills | Status: AC

## 2024-08-03 MED ORDER — GOJJI WEIGHT SCALE MISC: 1.0000 | 0 refills | Status: AC

## 2024-08-03 NOTE — Progress Notes (Addendum)
 New OB Intake  I connected with Julieana C Lund  on 08/03/24 at  1:15 PM EST by MyChart Video Visit and verified that I am speaking with the correct person using two identifiers. Nurse is located at [facility] and pt is located at home.  I discussed the limitations, risks, security and privacy concerns of performing an evaluation and management service by telephone and the availability of in person appointments. I also discussed with the patient that there may be a patient responsible charge related to this service. The patient expressed understanding and agreed to proceed.  I explained I am completing New OB Intake today. We discussed EDD of 02/23/25 based on US  at 6.5 weeks. Pt is [id number]. I reviewed her allergies, medications and Medical/Surgical/OB history.    Patient Active Problem List   Diagnosis Date Noted   Supervision of high risk pregnancy, antepartum 08/03/2024   History of prior pregnancy with IUGR newborn 08/03/2024   Obesity affecting pregnancy 08/03/2024   UTI in pregnancy, antepartum, first trimester 08/03/2024   Posttraumatic stress disorder    MDD (major depressive disorder), severe (HCC) 08/09/2018     Concerns addressed today  Delivery Plans Plans to deliver at [facility]. Discussed the nature of our practice with multiple providers including residents and students as well as female and female providers. Due to the size of the practice, the delivering provider may not be the same as those providing prenatal care.   Patient is interested in water birth.  MyChart/Babyscripts MyChart access verified. I explained pt will have some visits in office and some virtually. Babyscripts instructions given and order placed. Patient verifies receipt of registration text/e-mail. Account successfully created and app downloaded. If patient is a candidate for Optimized scheduling, add to sticky note.   Blood Pressure Cuff/Weight Scale Blood pressure cuff ordered for patient to pick-up  from [employer]. Explained after first prenatal appt pt will check weekly and document in Babyscripts. Patient does not have weight scale; order sent to [facility], patient may track weight weekly in Babyscripts.  Anatomy US  Explained next scheduled US  will be around 19 weeks or as soon as possible . Anatomy US  scheduled for 10/06/24 at 1000.  Is patient a CenteringPregnancy candidate?  Considering ; will decide at new ob visit.   Is patient a Mom+Baby Combined Care candidate?  Not a candidate    Is patient a candidate for Babyscripts Optimization? No, due to hx IUGR x2.   First visit review I reviewed new OB appt with patient. Explained pt will be seen by Olam Leftwich-Kirby,CNM at first visit. Discussed [place] genetic screening with patient. She would like Panorama drawn with routine prenatal labs at new ob visit   Last Pap Diagnosis  Date Value Ref Range Status  05/22/2022   Final   - Negative for intraepithelial lesion or malignancy (NILM)    Edgardo Petrenko,RN 08/03/2024  2:01 PM

## 2024-08-03 NOTE — Addendum Note (Signed)
 Addended by: Zayed Griffie L on: 08/03/2024 02:07 PM   Modules accepted: Orders

## 2024-08-09 ENCOUNTER — Other Ambulatory Visit: Payer: Self-pay

## 2024-08-09 ENCOUNTER — Encounter: Payer: Self-pay | Admitting: Advanced Practice Midwife

## 2024-08-09 ENCOUNTER — Ambulatory Visit (INDEPENDENT_AMBULATORY_CARE_PROVIDER_SITE_OTHER): Payer: Self-pay | Admitting: Advanced Practice Midwife

## 2024-08-09 VITALS — BP 119/81 | HR 120 | Wt 216.4 lb

## 2024-08-09 DIAGNOSIS — O0990 Supervision of high risk pregnancy, unspecified, unspecified trimester: Secondary | ICD-10-CM

## 2024-08-09 DIAGNOSIS — Z3490 Encounter for supervision of normal pregnancy, unspecified, unspecified trimester: Secondary | ICD-10-CM

## 2024-08-09 DIAGNOSIS — Z3A11 11 weeks gestation of pregnancy: Secondary | ICD-10-CM | POA: Diagnosis not present

## 2024-08-09 DIAGNOSIS — Z3491 Encounter for supervision of normal pregnancy, unspecified, first trimester: Secondary | ICD-10-CM | POA: Diagnosis not present

## 2024-08-10 ENCOUNTER — Encounter: Payer: Self-pay | Admitting: Advanced Practice Midwife

## 2024-08-10 LAB — CBC/D/PLT+RPR+RH+ABO+RUBIGG...
Antibody Screen: NEGATIVE
Basophils Absolute: 0 x10E3/uL (ref 0.0–0.2)
Basos: 1 %
EOS (ABSOLUTE): 0.3 x10E3/uL (ref 0.0–0.4)
Eos: 3 %
HCV Ab: NONREACTIVE
HIV Screen 4th Generation wRfx: NONREACTIVE
Hematocrit: 42.5 % (ref 34.0–46.6)
Hemoglobin: 14 g/dL (ref 11.1–15.9)
Hepatitis B Surface Ag: NEGATIVE
Immature Grans (Abs): 0 x10E3/uL (ref 0.0–0.1)
Immature Granulocytes: 0 %
Lymphocytes Absolute: 2.5 x10E3/uL (ref 0.7–3.1)
Lymphs: 33 %
MCH: 29.1 pg (ref 26.6–33.0)
MCHC: 32.9 g/dL (ref 31.5–35.7)
MCV: 88 fL (ref 79–97)
Monocytes Absolute: 0.7 x10E3/uL (ref 0.1–0.9)
Monocytes: 9 %
Neutrophils Absolute: 4 x10E3/uL (ref 1.4–7.0)
Neutrophils: 54 %
Platelets: 262 x10E3/uL (ref 150–450)
RBC: 4.81 x10E6/uL (ref 3.77–5.28)
RDW: 13.3 % (ref 11.7–15.4)
RPR Ser Ql: NONREACTIVE
Rh Factor: POSITIVE
Rubella Antibodies, IGG: 4.39 {index} (ref >0.99)
WBC: 7.5 x10E3/uL (ref 3.4–10.8)

## 2024-08-10 LAB — HCV INTERPRETATION

## 2024-08-10 NOTE — Progress Notes (Signed)
 Subjective:   Jamie Moore is a 28 y.o. [id number] at [redacted]w[redacted]d by early ultrasound being seen today for her first obstetrical visit.  Her obstetrical history is significant for vaginal delivery x 1 and has MDD (major depressive disorder), severe (HCC); Posttraumatic stress disorder; Supervision of high risk pregnancy, antepartum; History of prior pregnancy with IUGR newborn; Obesity affecting pregnancy; and UTI in pregnancy, antepartum, first trimester on their problem list.. Patient does intend to breast feed. Pregnancy history fully reviewed.  Patient reports nausea.  HISTORY: OB History  Gravida Para Term Preterm AB Living  5 2 2  0 2 2  SAB IAB Ectopic Multiple Live Births  1 1 0 0 2    # Outcome Date GA Lbr Len/2nd Weight Sex Type Anes PTL Lv  5 Current           4 IAB 02/2024          3 Term 11/13/20 [redacted]w[redacted]d 04:10 / 00:05 6 lb 1.9 oz (2.775 kg) M Vag-Spont None  LIV     Birth Comments: IOL for IUGR     Name: Vialpando,BOY Arine     Apgar1: 8  Apgar5: 8  2 Term 05/21/18 [redacted]w[redacted]d 03:29 / 00:37 5 lb 13.5 oz (2.65 kg) F Vag-Spont EPI  LIV     Birth Comments: IUGR     Name: Gauntt,GIRL Tully     Apgar1: 9  Apgar5: 9  1 SAB 2014            Obstetric Comments  Growth restriction 2019 2022   Past Medical History:  Diagnosis Date   Anxiety    Chlamydia 2023   Depression    post partum 2019 2022   Headache    Ovarian cyst    UTI (urinary tract infection)    Past Surgical History:  Procedure Laterality Date   DILATION AND CURETTAGE OF UTERUS     Family History  Problem Relation Age of Onset   Hypertension Mother    Hypertension Father    Asthma Sister    Eczema Sister    Diabetes Maternal Grandmother    Diabetes Paternal Grandmother    Social History   Tobacco Use   Smoking status: Never   Smokeless tobacco: Never  Vaping Use   Vaping status: Never Used  Substance Use Topics   Alcohol use: Not Currently    Comment: social   Drug use: Not Currently     Types: Marijuana    Comment: last used April 2025   No Known Allergies Current Outpatient Medications on File Prior to Visit  Medication Sig Dispense Refill   dimenhyDRINATE (DRAMAMINE) 50 MG tablet Take 50 mg by mouth every 8 (eight) hours as needed.     famotidine (PEPCID) 20 MG tablet Take 1 tablet (20 mg total) by mouth 2 (two) times daily. 180 tablet 0   glycopyrrolate (ROBINUL) 2 MG tablet Take 1 tablet (2 mg total) by mouth 3 (three) times daily as needed. 30 tablet 3   metoCLOPramide  (REGLAN ) 10 MG tablet Take 1 tablet (10 mg total) by mouth every 6 (six) hours as needed for nausea or vomiting. 30 tablet 2   nitrofurantoin, macrocrystal-monohydrate, (MACROBID) 100 MG capsule Take 1 capsule (100 mg total) by mouth 2 (two) times daily. 14 capsule 0   ondansetron  (ZOFRAN -ODT) 8 MG disintegrating tablet Take 1 tablet (8 mg total) by mouth every 8 (eight) hours as needed. 30 tablet 2   Prenatal 27-1 MG TABS Take 1  tablet by mouth daily. 30 tablet 11   prochlorperazine  (COMPAZINE ) 10 MG tablet Take 1 tablet (10 mg total) by mouth 2 (two) times daily as needed for nausea or vomiting. 30 tablet 2   promethazine  (PHENERGAN ) 25 MG suppository Place 1 suppository (25 mg total) rectally every 6 (six) hours as needed for nausea or vomiting. 12 each 0   promethazine  (PHENERGAN ) 25 MG tablet Take 1 tablet (25 mg total) by mouth every 6 (six) hours as needed for nausea or vomiting. 30 tablet 0   scopolamine  (TRANSDERM-SCOP) 1 MG/3DAYS Place 1 patch (1 mg total) onto the skin every 3 (three) days. 10 patch 11   Blood Pressure Monitoring (BLOOD PRESSURE KIT) DEVI 1 Device by Does not apply route as needed. 1 each 0   Misc. Devices (GOJJI WEIGHT SCALE) MISC 1 Device by Does not apply route once a week. 1 each 0   [DISCONTINUED] sertraline  (ZOLOFT ) 50 MG tablet Take 1 tablet (50 mg total) by mouth daily. For depression (Patient not taking: Reported on 02/10/2019) 30 tablet 0   [DISCONTINUED] traZODone   (DESYREL ) 50 MG tablet Take 1 tablet (50 mg total) by mouth at bedtime as needed for sleep. (Patient not taking: Reported on 12/02/2018) 30 tablet 0   No current facility-administered medications on file prior to visit.    Exam   Vitals:   08/09/24 1545  BP: 119/81  Pulse: (!) 120  Weight: 216 lb 6.4 oz (98.2 kg)   Fetal Heart Rate (bpm): 166  VS reviewed, nursing note reviewed,  Constitutional: well developed, well nourished, no distress HEENT: normocephalic CV: normal rate Pulm/chest wall: normal effort Abdomen: soft Neuro: alert and oriented x 3 Skin: warm, dry Psych: affect normal    Assessment:   Pregnancy: [id number] Patient Active Problem List   Diagnosis Date Noted   Supervision of high risk pregnancy, antepartum 08/03/2024   History of prior pregnancy with IUGR newborn 08/03/2024   Obesity affecting pregnancy 08/03/2024   UTI in pregnancy, antepartum, first trimester 08/03/2024   Posttraumatic stress disorder    MDD (major depressive disorder), severe (HCC) 08/09/2018     Plan:  1. Supervision of high risk pregnancy, antepartum (Primary) --Anticipatory guidance about next visits/weeks of pregnancy given.   - Culture, OB Urine - GC/Chlamydia probe amp ([place])not at [facility] - CBC/D/Plt+RPR+Rh+ABO+RubIgG... - PANORAMA PRENATAL TEST    Initial labs drawn. Continue prenatal vitamins. Discussed and offered genetic screening options, including Quad screen/AFP, NIPS testing, and option to decline testing. Benefits/risks/alternatives reviewed. Pt aware that anatomy US  is form of genetic screening with lower accuracy in detecting trisomies than blood work.  Pt chooses genetic screening today. NIPS: ordered. Ultrasound discussed; fetal anatomic survey: ordered. Problem list reviewed and updated. The nature of [facility] with multiple MDs and other Advanced Practice Providers was explained to patient; also emphasized that  residents, students are part of our team. Routine obstetric precautions reviewed. Return in about 4 weeks (around 09/06/2024) for Midwife preferred.   Olam Boards, CNM 08/10/24 3:21 PM

## 2024-08-14 ENCOUNTER — Encounter: Payer: Self-pay | Admitting: Obstetrics & Gynecology

## 2024-08-16 LAB — PANORAMA PRENATAL TEST FULL PANEL:PANORAMA TEST PLUS 5 ADDITIONAL MICRODELETIONS: FETAL FRACTION: 8.5

## 2024-08-27 DIAGNOSIS — J020 Streptococcal pharyngitis: Secondary | ICD-10-CM | POA: Diagnosis not present

## 2024-09-12 ENCOUNTER — Encounter: Admitting: Student

## 2024-09-13 ENCOUNTER — Ambulatory Visit (INDEPENDENT_AMBULATORY_CARE_PROVIDER_SITE_OTHER): Admitting: Family Medicine

## 2024-09-13 ENCOUNTER — Encounter: Payer: Self-pay | Admitting: Family Medicine

## 2024-09-13 ENCOUNTER — Other Ambulatory Visit: Payer: Self-pay

## 2024-09-13 VITALS — BP 126/85 | HR 97 | Wt 218.0 lb

## 2024-09-13 DIAGNOSIS — Z3A16 16 weeks gestation of pregnancy: Secondary | ICD-10-CM | POA: Diagnosis not present

## 2024-09-13 DIAGNOSIS — G43109 Migraine with aura, not intractable, without status migrainosus: Secondary | ICD-10-CM

## 2024-09-13 DIAGNOSIS — O99212 Obesity complicating pregnancy, second trimester: Secondary | ICD-10-CM | POA: Diagnosis not present

## 2024-09-13 DIAGNOSIS — Z3490 Encounter for supervision of normal pregnancy, unspecified, unspecified trimester: Secondary | ICD-10-CM

## 2024-09-13 MED ORDER — ASPIRIN 81 MG PO TBEC: 81.0000 mg | DELAYED_RELEASE_TABLET | Freq: Every day | ORAL | 2 refills | Status: AC

## 2024-09-13 MED ORDER — METOCLOPRAMIDE HCL 10 MG PO TABS: 10.0000 mg | ORAL_TABLET | Freq: Four times a day (QID) | ORAL | 2 refills | Status: AC | PRN

## 2024-09-13 NOTE — Progress Notes (Signed)
 "  PRENATAL VISIT NOTE  Subjective:  Jamie Moore is a 28 y.o. [phone] at [redacted]w[redacted]d being seen today for ongoing prenatal care.  She is currently monitored for the following issues for this high-risk pregnancy and has MDD (major depressive disorder), severe (HCC); Posttraumatic stress disorder; Supervision of low-risk pregnancy, unspecified trimester; History of prior pregnancy with IUGR newborn; and Obesity affecting pregnancy on their problem list.  Patient reports migraines.  Contractions: Not present. Vag. Bleeding: None.  Movement: Absent. Denies leaking of fluid.   The following portions of the patient's history were reviewed and updated as appropriate: allergies, current medications, past family history, past medical history, past social history, past surgical history and problem list.   Objective:   Vitals:   09/13/24 0911  BP: 126/85  Pulse: 97  Weight: 218 lb (98.9 kg)    Fetal Status:  Fetal Heart Rate (bpm): 147   Movement: Absent    General: Alert, oriented and cooperative. Patient is in no acute distress.  Skin: Skin is warm and dry. No rash noted.   Cardiovascular: Normal heart rate noted  Respiratory: Normal respiratory effort, no problems with respiration noted  Abdomen: Soft, gravid, appropriate for gestational age.  Pain/Pressure: Absent     Pelvic: Cervical exam deferred        Extremities: Normal range of motion.  Edema: None  Mental Status: Normal mood and affect. Normal behavior. Normal judgment and thought content.      08/09/2024    4:06 PM 05/31/2024   11:40 AM 10/28/2023    4:56 PM  Depression screen PHQ 2/9  Decreased Interest 0 0 0  Down, Depressed, Hopeless 0 0 0  PHQ - 2 Score 0 0 0  Altered sleeping 1 1 0  Tired, decreased energy 1 1 0  Change in appetite 0 1 2  Feeling bad or failure about yourself  0 0 0  Trouble concentrating 0 0 0  Moving slowly or fidgety/restless 0 0 0  Suicidal thoughts 0 0 0  PHQ-9 Score 2 3  2       Data saved with  a previous flowsheet row definition        08/09/2024    4:06 PM 05/31/2024   11:40 AM 10/28/2023    4:56 PM 01/03/2021    4:10 PM  GAD 7 : Generalized Anxiety Score  Nervous, Anxious, on Edge 0 0 0 1  Control/stop worrying 0 0 0 0  Worry too much - different things 0 0 1 0  Trouble relaxing 0 0 0 0  Restless 0 0 0 2  Easily annoyed or irritable 0 0 1 2  Afraid - awful might happen 0 0 0 0  Total GAD 7 Score 0 0 2 5    Assessment and Plan:  Pregnancy: [id number] at [redacted]w[redacted]d 1. Supervision of low-risk pregnancy, unspecified trimester (Primary) Prenatal course reviewed BP, HR, FHR within normal limits  2. Obesity affecting pregnancy in second trimester, unspecified obesity type Start aspirin  81 mg daily 2 lb weight gain since last visit Discussed high protein snacks to ensure appropriate weight gain since patient does not eat large volumes  3. [redacted] weeks gestation of pregnancy Follow up in 4 weeks Anatomy US  scheduled for 10/07/2023   4. Migraine Start a daily magnesium  supplement for migraine prevention Use Tylenol  and Reglan  prn as rescue medication. Add Benadryl  if needed.  Preterm labor symptoms and general obstetric precautions including but not limited to vaginal bleeding, contractions, leaking of fluid and  fetal movement were reviewed in detail with the patient. Please refer to After Visit Summary for other counseling recommendations.   Return in about 4 weeks (around 10/11/2024) for LOB.  Future Appointments  Date Time Provider Department Center  10/06/2024 10:00 AM [facility] PROVIDER 1 [facility]  10/06/2024 10:30 AM [facility] US5 [facility]  10/10/2024  1:55 PM Cresenzo, Norleen GAILS, MD [facility]    Joesph DELENA Sear, PA "

## 2024-09-13 NOTE — Patient Instructions (Addendum)
 HEADACHE: START a daily magnesium  supplement. Magnesium  Oxide or Magnesium  Glycinate 500 mg at bed (up to 800 mg daily) If you get a headache at home, I recommend doing the following: 1) Drink 64 oz of water. Make sure you have something to eat. 2) Take 1000mg  tylenol  (2 extra-strength pills) PLUS Reglan  10 mg. Alternatively, you can take Excedrin-Tension (NOT Excedrin migraine) PLUS Reglan  10 mg. Look for TENSION headache formulation. The main ingredients should be Acetaminophen  (same as Tylenol ) and Caffeine ; should NOT have Aspirin  in the formulation  3) If you need to you can also take Benadryl . This will likely make you very sleepy. Please take a nap as sleep can help headaches go away.

## 2024-10-06 ENCOUNTER — Ambulatory Visit (HOSPITAL_BASED_OUTPATIENT_CLINIC_OR_DEPARTMENT_OTHER): Admitting: Obstetrics and Gynecology

## 2024-10-06 ENCOUNTER — Ambulatory Visit: Attending: Advanced Practice Midwife

## 2024-10-06 VITALS — BP 120/75 | HR 91

## 2024-10-06 DIAGNOSIS — Z3A20 20 weeks gestation of pregnancy: Secondary | ICD-10-CM

## 2024-10-06 DIAGNOSIS — E669 Obesity, unspecified: Secondary | ICD-10-CM | POA: Diagnosis not present

## 2024-10-06 DIAGNOSIS — O99212 Obesity complicating pregnancy, second trimester: Secondary | ICD-10-CM | POA: Diagnosis not present

## 2024-10-06 DIAGNOSIS — O0990 Supervision of high risk pregnancy, unspecified, unspecified trimester: Secondary | ICD-10-CM | POA: Insufficient documentation

## 2024-10-06 DIAGNOSIS — O09292 Supervision of pregnancy with other poor reproductive or obstetric history, second trimester: Secondary | ICD-10-CM

## 2024-10-06 DIAGNOSIS — Z8759 Personal history of other complications of pregnancy, childbirth and the puerperium: Secondary | ICD-10-CM | POA: Insufficient documentation

## 2024-10-06 DIAGNOSIS — Z3490 Encounter for supervision of normal pregnancy, unspecified, unspecified trimester: Secondary | ICD-10-CM

## 2024-10-06 DIAGNOSIS — O99210 Obesity complicating pregnancy, unspecified trimester: Secondary | ICD-10-CM | POA: Diagnosis present

## 2024-10-06 NOTE — Progress Notes (Signed)
 After review, MFM consult with provider is not indicated for today  Arna Ranks, MD 10/06/2024 11:24 AM  [facility]

## 2024-10-10 ENCOUNTER — Encounter: Payer: Self-pay | Admitting: Family Medicine

## 2024-11-07 ENCOUNTER — Encounter: Payer: Self-pay | Admitting: Obstetrics and Gynecology

## 2024-12-01 ENCOUNTER — Ambulatory Visit

## 2024-12-01 ENCOUNTER — Other Ambulatory Visit
